# Patient Record
Sex: Male | Born: 1955 | Race: Black or African American | Hispanic: No | Marital: Married | Smoking: Current every day smoker
Health system: Southern US, Community
[De-identification: ages and names within clinical notes are randomized; demographics above are authoritative.]

## PROBLEM LIST (undated history)

## (undated) DIAGNOSIS — C9000 Multiple myeloma not having achieved remission: Secondary | ICD-10-CM

## (undated) DIAGNOSIS — N400 Enlarged prostate without lower urinary tract symptoms: Secondary | ICD-10-CM

## (undated) DIAGNOSIS — J45909 Unspecified asthma, uncomplicated: Secondary | ICD-10-CM

## (undated) DIAGNOSIS — R109 Unspecified abdominal pain: Secondary | ICD-10-CM

## (undated) DIAGNOSIS — K5090 Crohn's disease, unspecified, without complications: Secondary | ICD-10-CM

## (undated) HISTORY — PX: SMALL INTESTINE SURGERY: SHX150

---

## 2011-04-29 ENCOUNTER — Emergency Department (HOSPITAL_BASED_OUTPATIENT_CLINIC_OR_DEPARTMENT_OTHER)
Admission: EM | Admit: 2011-04-29 | Discharge: 2011-04-29 | Disposition: A | Discharge status: Home / Self Care | Payer: Medicare Other | Attending: Emergency Medicine | Admitting: Emergency Medicine

## 2011-04-29 DIAGNOSIS — M25539 Pain in unspecified wrist: Secondary | ICD-10-CM | POA: Insufficient documentation

## 2011-04-29 DIAGNOSIS — F17200 Nicotine dependence, unspecified, uncomplicated: Secondary | ICD-10-CM | POA: Insufficient documentation

## 2011-04-29 DIAGNOSIS — K5090 Crohn's disease, unspecified, without complications: Secondary | ICD-10-CM | POA: Insufficient documentation

## 2012-03-28 DIAGNOSIS — N529 Male erectile dysfunction, unspecified: Secondary | ICD-10-CM | POA: Insufficient documentation

## 2013-01-16 ENCOUNTER — Emergency Department (HOSPITAL_BASED_OUTPATIENT_CLINIC_OR_DEPARTMENT_OTHER): Payer: Medicare Other

## 2013-01-16 ENCOUNTER — Emergency Department (HOSPITAL_BASED_OUTPATIENT_CLINIC_OR_DEPARTMENT_OTHER)
Admission: EM | Admit: 2013-01-16 | Discharge: 2013-01-17 | Disposition: A | Discharge status: Home / Self Care | Payer: Medicare Other | Attending: Emergency Medicine | Admitting: Emergency Medicine

## 2013-01-16 ENCOUNTER — Encounter (HOSPITAL_BASED_OUTPATIENT_CLINIC_OR_DEPARTMENT_OTHER): Payer: Self-pay | Admitting: Emergency Medicine

## 2013-01-16 DIAGNOSIS — F17200 Nicotine dependence, unspecified, uncomplicated: Secondary | ICD-10-CM | POA: Insufficient documentation

## 2013-01-16 DIAGNOSIS — N400 Enlarged prostate without lower urinary tract symptoms: Secondary | ICD-10-CM | POA: Insufficient documentation

## 2013-01-16 DIAGNOSIS — K5090 Crohn's disease, unspecified, without complications: Secondary | ICD-10-CM

## 2013-01-16 DIAGNOSIS — Z79899 Other long term (current) drug therapy: Secondary | ICD-10-CM | POA: Insufficient documentation

## 2013-01-16 DIAGNOSIS — R112 Nausea with vomiting, unspecified: Secondary | ICD-10-CM | POA: Insufficient documentation

## 2013-01-16 DIAGNOSIS — K56609 Unspecified intestinal obstruction, unspecified as to partial versus complete obstruction: Secondary | ICD-10-CM

## 2013-01-16 DIAGNOSIS — R630 Anorexia: Secondary | ICD-10-CM | POA: Insufficient documentation

## 2013-01-16 HISTORY — DX: Unspecified abdominal pain: R10.9

## 2013-01-16 HISTORY — DX: Benign prostatic hyperplasia without lower urinary tract symptoms: N40.0

## 2013-01-16 LAB — URINALYSIS, ROUTINE W REFLEX MICROSCOPIC
Glucose, UA: NEGATIVE mg/dL
Hgb urine dipstick: NEGATIVE
Specific Gravity, Urine: 1.035 — ABNORMAL HIGH (ref 1.005–1.030)
Urobilinogen, UA: 1 mg/dL (ref 0.0–1.0)
pH: 6 (ref 5.0–8.0)

## 2013-01-16 LAB — COMPREHENSIVE METABOLIC PANEL
ALT: 7 U/L (ref 0–53)
AST: 18 U/L (ref 0–37)
Albumin: 3.5 g/dL (ref 3.5–5.2)
Alkaline Phosphatase: 98 U/L (ref 39–117)
Calcium: 9.8 mg/dL (ref 8.4–10.5)
Potassium: 3.2 mEq/L — ABNORMAL LOW (ref 3.5–5.1)
Sodium: 146 mEq/L — ABNORMAL HIGH (ref 135–145)
Total Protein: 7.7 g/dL (ref 6.0–8.3)

## 2013-01-16 LAB — CBC WITH DIFFERENTIAL/PLATELET
Basophils Absolute: 0 10*3/uL (ref 0.0–0.1)
Eosinophils Absolute: 0 10*3/uL (ref 0.0–0.7)
Eosinophils Relative: 0 % (ref 0–5)
Lymphs Abs: 1.5 10*3/uL (ref 0.7–4.0)
MCH: 28.4 pg (ref 26.0–34.0)
MCV: 83.6 fL (ref 78.0–100.0)
Neutrophils Relative %: 67 % (ref 43–77)
Platelets: 342 10*3/uL (ref 150–400)
RBC: 4.83 MIL/uL (ref 4.22–5.81)
RDW: 16.1 % — ABNORMAL HIGH (ref 11.5–15.5)
WBC: 7.7 10*3/uL (ref 4.0–10.5)

## 2013-01-16 MED ORDER — ONDANSETRON HCL 4 MG/2ML IJ SOLN
4.0000 mg | Freq: Once | INTRAMUSCULAR | Status: AC
  Administered 2013-01-16: 4 mg via INTRAVENOUS
  Filled 2013-01-16: qty 2

## 2013-01-16 MED ORDER — SODIUM CHLORIDE 0.9 % IV SOLN
Freq: Once | INTRAVENOUS | Status: AC
  Administered 2013-01-16: 22:00:00 via INTRAVENOUS

## 2013-01-16 MED ORDER — HYDROMORPHONE HCL PF 1 MG/ML IJ SOLN
1.0000 mg | Freq: Once | INTRAMUSCULAR | Status: AC
  Administered 2013-01-16: 1 mg via INTRAVENOUS
  Filled 2013-01-16: qty 1

## 2013-01-16 NOTE — ED Notes (Signed)
Patient informed of need for transfer to [facility] Emergency Department, pt states he does not want to go by EMS, but by private vehicle. Fannie Knee RN, spoke with patient about advantages of transferring via EMS and possible process he will experience if choosing to leave AMA and go by private vehicle. Pt given time to discuss wishes w/ family. Patient requesting to sign out AMA and go by private vehicle to [facility] ED. MD notified. This RN will notify [facility] ED Charge nurse of expectant arrival of patient.

## 2013-01-16 NOTE — ED Notes (Signed)
Patient refusing placement of NG tube at this time, states he has had previous procedure in the past and it was painful, does not want procedure at this time. Pt educated by Fannie Knee, RN on NG tube placement and outcomes r/t to small bowel obstruction. Pt still refused NG at this time. MD will be notified.

## 2013-01-16 NOTE — ED Notes (Signed)
Patient transported to X-ray via stretcher 

## 2013-01-16 NOTE — ED Notes (Signed)
Evan Gallegos, Consulting civil engineer at [facility] Emergency Department & gave report on patient & informed her of patient's expectant arrival by private vehicle.

## 2013-01-16 NOTE — ED Notes (Signed)
Pt with history of chron's developed abdominal pain this am that has progressed throughtout the day, pt reports last flair was several years ago

## 2013-01-16 NOTE — ED Notes (Signed)
Patient back from  X-ray 

## 2013-01-16 NOTE — ED Provider Notes (Signed)
History     CSN: [id number]  Arrival date & time 01/16/13  2111   First MD Initiated Contact with Patient 01/16/13 2145      Chief Complaint  Patient presents with  . Abdominal Pain    (Consider location/radiation/quality/duration/timing/severity/associated sxs/prior treatment) Patient is a 57 y.o. male presenting with abdominal pain. The history is provided by the patient. No language interpreter was used.  Abdominal Pain The primary symptoms of the illness include abdominal pain, nausea and vomiting. The onset of the illness was gradual. The problem has not changed since onset. The patient states that she believes she is currently not pregnant. Additional symptoms associated with the illness include chills. Significant associated medical issues include inflammatory bowel disease.  Pt has a history of Chron's.   Pt feels like he is having a flare up.   Pt complains of nausea and pain.   Pt has had bowel resections x 2  Past Medical History  Diagnosis Date  . Prostate enlargement   . Abdominal  pain, other specified site     crhon's disease    History reviewed. No pertinent past surgical history.  History reviewed. No pertinent family history.  History  Substance Use Topics  . Smoking status: Current Every Day Smoker -- 0.5 packs/day    Types: Cigarettes  . Smokeless tobacco: Not on file  . Alcohol Use: No      Review of Systems  Constitutional: Positive for chills.  Gastrointestinal: Positive for nausea, vomiting and abdominal pain.  All other systems reviewed and are negative.    Allergies  Penicillins  Home Medications   Current Outpatient Rx  Name  Route  Sig  Dispense  Refill  . PREDNISONE 5 MG PO TABS   Oral   Take 5 mg by mouth daily.         Marland Kitchen TAMSULOSIN HCL 0.4 MG PO CAPS   Oral   Take 0.4 mg by mouth.           BP 143/103  Pulse 73  Temp 98.4 F (36.9 C) (Oral)  Resp 18  Ht 5\' 11"  (1.803 m)  Wt 152 lb (68.947 kg)  BMI 21.20 kg/m2   SpO2 100%  Physical Exam  Nursing note and vitals reviewed. Constitutional: He appears well-developed and well-nourished.  HENT:  Head: Normocephalic and atraumatic.  Eyes: Conjunctivae normal are normal. Pupils are equal, round, and reactive to light.  Neck: Normal range of motion. Neck supple.  Cardiovascular: Normal rate and normal heart sounds.   Pulmonary/Chest: Effort normal and breath sounds normal.  Abdominal: Soft. There is tenderness. There is guarding.  Musculoskeletal: Normal range of motion.  Neurological: He is alert.  Skin: Skin is warm.  Psychiatric: He has a normal mood and affect.    ED Course  Procedures (including critical care time)  Labs Reviewed - No data to display No results found.   No diagnosis found.    MDM  Pt given Iv fluids,  Dilaudid, zofran and labs ordered        Lonia Skinner [place], [place] 01/16/13 2149

## 2013-01-16 NOTE — ED Notes (Signed)
MD at bedside. 

## 2013-01-16 NOTE — ED Provider Notes (Addendum)
History     CSN: [id number]  Arrival date & time 01/16/13  2111   First MD Initiated Contact with Patient 01/16/13 2145      Chief Complaint  Patient presents with  . Abdominal Pain    (Consider location/radiation/quality/duration/timing/severity/associated sxs/prior treatment) Patient is a 57 y.o. male presenting with abdominal pain. The history is provided by the patient.  Abdominal Pain The primary symptoms of the illness include abdominal pain, nausea and vomiting. The primary symptoms of the illness do not include fever or diarrhea. The current episode started 6 to 12 hours ago. The onset of the illness was gradual. The problem has been gradually worsening.  The abdominal pain is generalized. The abdominal pain does not radiate. The severity of the abdominal pain is 10/10. The abdominal pain is relieved by nothing.  Additional symptoms associated with the illness include anorexia. Symptoms associated with the illness do not include frequency or back pain. Significant associated medical issues include inflammatory bowel disease.  Pt has felt bloated.  No BM today.  He has history of Crohn's with prior partial bowel resection.  Past Medical History  Diagnosis Date  . Prostate enlargement   . Abdominal  pain, other specified site     crhon's disease    History reviewed. No pertinent past surgical history.  History reviewed. No pertinent family history.  History  Substance Use Topics  . Smoking status: Current Every Day Smoker -- 0.5 packs/day    Types: Cigarettes  . Smokeless tobacco: Not on file  . Alcohol Use: No      Review of Systems  Constitutional: Negative for fever.  Gastrointestinal: Positive for nausea, vomiting, abdominal pain and anorexia. Negative for diarrhea.  Genitourinary: Negative for frequency.  Musculoskeletal: Negative for back pain.  All other systems reviewed and are negative.    Allergies  Penicillins  Home Medications   Current  Outpatient Rx  Name  Route  Sig  Dispense  Refill  . PREDNISONE 5 MG PO TABS   Oral   Take 5 mg by mouth daily.         Marland Kitchen TAMSULOSIN HCL 0.4 MG PO CAPS   Oral   Take 0.4 mg by mouth.           BP 143/103  Pulse 73  Temp 98.4 F (36.9 C) (Oral)  Resp 18  Ht 5\' 11"  (1.803 m)  Wt 152 lb (68.947 kg)  BMI 21.20 kg/m2  SpO2 100%  Physical Exam  Nursing note and vitals reviewed. Constitutional: He appears well-developed and well-nourished. No distress.  HENT:  Head: Normocephalic and atraumatic.  Right Ear: External ear normal.  Left Ear: External ear normal.  Eyes: Conjunctivae normal are normal. Right eye exhibits no discharge. Left eye exhibits no discharge. No scleral icterus.  Neck: Neck supple. No tracheal deviation present.  Cardiovascular: Normal rate, regular rhythm and intact distal pulses.   Pulmonary/Chest: Effort normal and breath sounds normal. No stridor. No respiratory distress. He has no wheezes. He has no rales.  Abdominal: Soft. He exhibits no distension, no ascites and no mass. Bowel sounds are decreased. There is generalized tenderness. There is guarding. There is no rigidity, no rebound and no CVA tenderness. No hernia.  Musculoskeletal: He exhibits no edema and no tenderness.  Neurological: He is alert. He has normal strength. No sensory deficit. Cranial nerve deficit:  no gross defecits noted. He exhibits normal muscle tone. He displays no seizure activity. Coordination normal.  Skin: Skin is warm and  dry. No rash noted.  Psychiatric: He has a normal mood and affect.    ED Course  Procedures (including critical care time) IV pain medications and antiemetics.  Symptoms improving.   Labs Reviewed  CBC WITH DIFFERENTIAL - Abnormal; Notable for the following:    RDW 16.1 (*)     Monocytes Relative 13 (*)     All other components within normal limits  COMPREHENSIVE METABOLIC PANEL - Abnormal; Notable for the following:    Sodium 146 (*)     Potassium  3.2 (*)     Glucose, Bld 117 (*)     Total Bilirubin 0.2 (*)     GFR calc non Af Amer 60 (*)     GFR calc Af Amer 70 (*)     All other components within normal limits  URINALYSIS, ROUTINE W REFLEX MICROSCOPIC - Abnormal; Notable for the following:    Color, Urine AMBER (*)  BIOCHEMICALS MAY BE AFFECTED BY COLOR   Specific Gravity, Urine 1.035 (*)     Bilirubin Urine SMALL (*)     Ketones, ur 15 (*)     All other components within normal limits  LIPASE, BLOOD   Dg Abd Acute W/chest  01/16/2013  *RADIOLOGY REPORT*  Clinical Data: Sudden onset lower abdominal pain.  History of partial small bowel removal.  ACUTE ABDOMEN SERIES (ABDOMEN 2 VIEW & CHEST 1 VIEW)  Comparison: None.  Findings: The heart size and pulmonary vascularity are normal. The lungs appear clear and expanded without focal air space disease or consolidation. No blunting of the costophrenic angles.  No pneumothorax.  Mediastinal contours appear intact.  There is marked gaseous distension of mid abdominal small bowel loops with multiple air-fluid levels consistent with high-grade small bowel obstruction.  Paucity of gas and stool in the colon. No free intra-abdominal air.  Postsurgical changes in the mid abdomen.  Degenerative changes in the spine and hips.  IMPRESSION: No evidence of active pulmonary disease.  Gas distended small bowel with multiple air-fluid levels consistent with small bowel obstruction.   Original Report Authenticated By: Burman Nieves, M.D.      1. Small bowel obstruction   2. Crohn's disease       MDM  Plain films demonstrate a small bowel obstruction.  Pt would like to be transferred to [facility].  Will order an NG tube and arrange transfer.    Celene Kras, MD 01/16/13 2315  Pt has now informed us that he is going to have his wife drive him to [facility].  We cautioned him against this plan. He refuses EMS transfer.  Also refuses NG tube.  Will notify [facility].  Pt is leaving AMA to transport  himself to [facility].  Celene Kras, MD 01/16/13 [phone]

## 2013-01-17 DIAGNOSIS — K56609 Unspecified intestinal obstruction, unspecified as to partial versus complete obstruction: Secondary | ICD-10-CM | POA: Insufficient documentation

## 2013-09-25 ENCOUNTER — Encounter (HOSPITAL_BASED_OUTPATIENT_CLINIC_OR_DEPARTMENT_OTHER): Payer: Self-pay | Admitting: Emergency Medicine

## 2013-09-25 ENCOUNTER — Emergency Department (HOSPITAL_BASED_OUTPATIENT_CLINIC_OR_DEPARTMENT_OTHER)
Admission: EM | Admit: 2013-09-25 | Discharge: 2013-09-25 | Disposition: A | Discharge status: Home / Self Care | Payer: Medicare Other | Attending: Emergency Medicine | Admitting: Emergency Medicine

## 2013-09-25 DIAGNOSIS — N400 Enlarged prostate without lower urinary tract symptoms: Secondary | ICD-10-CM | POA: Insufficient documentation

## 2013-09-25 DIAGNOSIS — Z880 Allergy status to penicillin: Secondary | ICD-10-CM | POA: Insufficient documentation

## 2013-09-25 DIAGNOSIS — F17200 Nicotine dependence, unspecified, uncomplicated: Secondary | ICD-10-CM | POA: Insufficient documentation

## 2013-09-25 DIAGNOSIS — IMO0002 Reserved for concepts with insufficient information to code with codable children: Secondary | ICD-10-CM | POA: Insufficient documentation

## 2013-09-25 DIAGNOSIS — K5090 Crohn's disease, unspecified, without complications: Secondary | ICD-10-CM

## 2013-09-25 DIAGNOSIS — E876 Hypokalemia: Secondary | ICD-10-CM | POA: Insufficient documentation

## 2013-09-25 HISTORY — DX: Crohn's disease, unspecified, without complications: K50.90

## 2013-09-25 LAB — CBC WITH DIFFERENTIAL/PLATELET
Eosinophils Relative: 0 % (ref 0–5)
HCT: 39 % (ref 39.0–52.0)
Hemoglobin: 13 g/dL (ref 13.0–17.0)
Lymphocytes Relative: 10 % — ABNORMAL LOW (ref 12–46)
Lymphs Abs: 0.8 10*3/uL (ref 0.7–4.0)
MCV: 86.3 fL (ref 78.0–100.0)
Monocytes Relative: 10 % (ref 3–12)
Platelets: 315 10*3/uL (ref 150–400)
RBC: 4.52 MIL/uL (ref 4.22–5.81)
WBC: 7.8 10*3/uL (ref 4.0–10.5)

## 2013-09-25 LAB — URINE MICROSCOPIC-ADD ON

## 2013-09-25 LAB — COMPREHENSIVE METABOLIC PANEL
ALT: 8 U/L (ref 0–53)
AST: 20 U/L (ref 0–37)
CO2: 30 mEq/L (ref 19–32)
Chloride: 101 mEq/L (ref 96–112)
GFR calc non Af Amer: 66 mL/min — ABNORMAL LOW (ref >90)
Sodium: 143 mEq/L (ref 135–145)
Total Bilirubin: 0.2 mg/dL — ABNORMAL LOW (ref 0.3–1.2)

## 2013-09-25 LAB — URINALYSIS, ROUTINE W REFLEX MICROSCOPIC
Glucose, UA: NEGATIVE mg/dL
Hgb urine dipstick: NEGATIVE
Ketones, ur: 15 mg/dL — AB
Leukocytes, UA: NEGATIVE
pH: 6 (ref 5.0–8.0)

## 2013-09-25 MED ORDER — POTASSIUM CHLORIDE ER 10 MEQ PO TBCR: 20.0000 meq | EXTENDED_RELEASE_TABLET | Freq: Every day | ORAL | Status: AC

## 2013-09-25 MED ORDER — MORPHINE SULFATE 4 MG/ML IJ SOLN
2.0000 mg | Freq: Once | INTRAMUSCULAR | Status: AC
  Administered 2013-09-25: 2 mg via INTRAVENOUS
  Filled 2013-09-25: qty 1

## 2013-09-25 MED ORDER — PREDNISONE 10 MG PO TABS: ORAL_TABLET | ORAL | Status: DC

## 2013-09-25 MED ORDER — POTASSIUM CHLORIDE CRYS ER 20 MEQ PO TBCR
40.0000 meq | EXTENDED_RELEASE_TABLET | Freq: Once | ORAL | Status: AC
  Administered 2013-09-25: 40 meq via ORAL
  Filled 2013-09-25: qty 2

## 2013-09-25 MED ORDER — SODIUM CHLORIDE 0.9 % IV BOLUS (SEPSIS)
1000.0000 mL | Freq: Once | INTRAVENOUS | Status: AC
  Administered 2013-09-25: 1000 mL via INTRAVENOUS

## 2013-09-25 NOTE — ED Notes (Signed)
Pt sts that he has Crohn's Disease and has had diarrhea, nausea vomiting since yesterday afternoon; only vomiting twice last night; +chills, which pt reports normal with Crohn's exacerbation; decrease in PO intake. Pt sts he thinks he is dehydrated.

## 2013-09-25 NOTE — ED Notes (Signed)
Pt told this RN and MD that pt's pharmacy was previously [facility] on [address] in [place], but then had all of his meds transferred to [place] on [address] 2 months ago. This RN called [facility] and pharmacy sts that the only meds they have are Vit D2 and Vit D3, which have been filled but never picked up. Unable to contact any staff at [facility] on [address], MD made aware of this info.

## 2013-09-25 NOTE — ED Provider Notes (Signed)
CSN: [id number]     Arrival date & time 09/25/13  1155 History   None    Chief Complaint  Patient presents with  . Crohn's Disease    HPI  Evan Gallegos is a 57 y.o. male with a PMH of Crohn's disease who presents to the ED for evaluation of Chron's disease.  History was provided by the patient.  Patient is a poor historian. Patient states that he developed abdominal pain, nausea, vomiting, and diarrhea last night. He states his stool is watery and brown. No hematochezia rectal bleeding or rectal pain. He also had 2-3 episodes of emesis last night however no emesis today. He is currently nauseated. He also has had chills with no fever. He states that this is his normal symptoms when he gets a Crohn's exacerbation. He states he feels dehydrated and has not been able to your drink anything today. He complains of periumbilical aching abdominal pain. He states that this pain comes and goes. He is not taking anything for pain prior to arrival. He states his medications for his Crohn's however cannot elaborate on this. He states was previously on prednisone however stopped taking it approximately one week ago. Patient states that [facility] (name unknown) has been prescribing and prednisone to take as needed when he gets a flareup. He states that he is out of his prednisone. He has a previous surgery for his Crohn's with bowel removal.  He otherwise has been well with no cough, rhinorrhea, sore throat, headache, dizziness, lightheadedness.    Past Medical History  Diagnosis Date  . Prostate enlargement   . Abdominal  pain, other specified site     crhon's disease   No past surgical history on file. No family history on file. History  Substance Use Topics  . Smoking status: Current Every Day Smoker -- 0.50 packs/day    Types: Cigarettes  . Smokeless tobacco: Not on file  . Alcohol Use: No    Review of Systems  Constitutional: Positive for chills. Negative  for fever, activity change, appetite change and fatigue.  HENT: Negative for congestion, ear pain, rhinorrhea and sore throat.   Eyes: Negative for visual disturbance.  Respiratory: Negative for cough, shortness of breath and wheezing.   Cardiovascular: Negative for chest pain and leg swelling.  Gastrointestinal: Positive for nausea, vomiting, abdominal pain and diarrhea. Negative for constipation, blood in stool and rectal pain.  Genitourinary: Negative for dysuria, hematuria, flank pain and difficulty urinating.  Musculoskeletal: Negative for back pain, gait problem and myalgias.  Skin: Negative for color change and wound.  Neurological: Negative for dizziness, syncope, weakness, light-headedness, numbness and headaches.  Psychiatric/Behavioral: Negative for confusion.    Allergies  Penicillins  Home Medications   Current Outpatient Rx  Name  Route  Sig  Dispense  Refill  . predniSONE (DELTASONE) 5 MG tablet   Oral   Take 5 mg by mouth daily.         . Tamsulosin HCl (FLOMAX) 0.4 MG CAPS   Oral   Take 0.4 mg by mouth.          BP 153/91  Pulse 64  Temp(Src) 97.4 F (36.3 C) (Oral)  Resp 18  SpO2 100%  Filed Vitals:   09/25/13 1210 09/25/13 1228  BP: 153/91   Pulse: 64   Temp: 97.4 F (36.3 C)   TempSrc: Oral   Resp: 18   Height:  5\' 10"  (1.778 m)  Weight:  152  lb (68.947 kg)  SpO2: 100%     Physical Exam  Nursing note and vitals reviewed. Constitutional: He is oriented to person, place, and time. He appears well-developed and well-nourished. No distress.  HENT:  Head: Normocephalic and atraumatic.  Right Ear: External ear normal.  Left Ear: External ear normal.  Nose: Nose normal.  Mouth/Throat: Oropharynx is clear and moist. No oropharyngeal exudate.  Eyes: Conjunctivae are normal. Pupils are equal, round, and reactive to light. Right eye exhibits no discharge. Left eye exhibits no discharge.  Neck: Normal range of motion. Neck supple.   Cardiovascular: Normal rate, regular rhythm, normal heart sounds and intact distal pulses.  Exam reveals no gallop and no friction rub.   No murmur heard. Pulmonary/Chest: Effort normal and breath sounds normal. No respiratory distress. He has no wheezes. He has no rales. He exhibits no tenderness.  Abdominal: Soft. Bowel sounds are normal. He exhibits no distension and no mass. There is no tenderness. There is no rebound and no guarding.  Surgical scar present on the abdomen   Genitourinary: Guaiac negative stool.  No anal fissures or hemorrhoids. No palpable stool in the rectal vault. No gross blood.   Musculoskeletal: Normal range of motion. He exhibits no edema and no tenderness.  Neurological: He is alert and oriented to person, place, and time.  Skin: Skin is warm and dry. He is not diaphoretic.    ED Course  Procedures (including critical care time) Labs Review Labs Reviewed - No data to display Imaging Review No results found.  EKG Interpretation   None      Results for orders placed during the hospital encounter of 09/25/13  URINE CULTURE      Result Value Range   Specimen Description URINE, RANDOM     Special Requests NONE     Culture  Setup Time       Value: 09/26/2013 09:26     Performed at [facility] Count       Value: 9,000 COLONIES/ML     Performed at [facility]   Culture       Value: INSIGNIFICANT GROWTH     Performed at [facility]   Report Status 09/27/2013 FINAL    CBC WITH DIFFERENTIAL      Result Value Range   WBC 7.8  4.0 - 10.5 K/uL   RBC 4.52  4.22 - 5.81 MIL/uL   Hemoglobin 13.0  13.0 - 17.0 g/dL   HCT 45.4  09.8 - 11.9 %   MCV 86.3  78.0 - 100.0 fL   MCH 28.8  26.0 - 34.0 pg   MCHC 33.3  30.0 - 36.0 g/dL   RDW 14.7 (*) 82.9 - 56.2 %   Platelets 315  150 - 400 K/uL   Neutrophils Relative % 81 (*) 43 - 77 %   Neutro Abs 6.3  1.7 - 7.7 K/uL   Lymphocytes Relative 10 (*) 12 - 46 %   Lymphs Abs 0.8  0.7  - 4.0 K/uL   Monocytes Relative 10  3 - 12 %   Monocytes Absolute 0.8  0.1 - 1.0 K/uL   Eosinophils Relative 0  0 - 5 %   Eosinophils Absolute 0.0  0.0 - 0.7 K/uL   Basophils Relative 0  0 - 1 %   Basophils Absolute 0.0  0.0 - 0.1 K/uL  URINALYSIS, ROUTINE W REFLEX MICROSCOPIC      Result Value Range   Color, Urine YELLOW  YELLOW   APPearance CLEAR  CLEAR   Specific Gravity, Urine 1.034 (*) 1.005 - 1.030   pH 6.0  5.0 - 8.0   Glucose, UA NEGATIVE  NEGATIVE mg/dL   Hgb urine dipstick NEGATIVE  NEGATIVE   Bilirubin Urine SMALL (*) NEGATIVE   Ketones, ur 15 (*) NEGATIVE mg/dL   Protein, ur 30 (*) NEGATIVE mg/dL   Urobilinogen, UA 0.2  0.0 - 1.0 mg/dL   Nitrite NEGATIVE  NEGATIVE   Leukocytes, UA NEGATIVE  NEGATIVE  COMPREHENSIVE METABOLIC PANEL      Result Value Range   Sodium 143  135 - 145 mEq/L   Potassium 3.0 (*) 3.5 - 5.1 mEq/L   Chloride 101  96 - 112 mEq/L   CO2 30  19 - 32 mEq/L   Glucose, Bld 100 (*) 70 - 99 mg/dL   BUN 18  6 - 23 mg/dL   Creatinine, Ser 1.61  0.50 - 1.35 mg/dL   Calcium 9.4  8.4 - 09.6 mg/dL   Total Protein 7.2  6.0 - 8.3 g/dL   Albumin 3.1 (*) 3.5 - 5.2 g/dL   AST 20  0 - 37 U/L   ALT 8  0 - 53 U/L   Alkaline Phosphatase 94  39 - 117 U/L   Total Bilirubin 0.2 (*) 0.3 - 1.2 mg/dL   GFR calc non Af Amer 66 (*) >90 mL/min   GFR calc Af Amer 76 (*) >90 mL/min  LIPASE, BLOOD      Result Value Range   Lipase 9 (*) 11 - 59 U/L  OCCULT BLOOD X 1 CARD TO LAB, STOOL      Result Value Range   Fecal Occult Bld NEGATIVE  NEGATIVE  URINE MICROSCOPIC-ADD ON      Result Value Range   Squamous Epithelial / LPF RARE  RARE   Bacteria, UA FEW (*) RARE   Casts HYALINE CASTS (*) NEGATIVE   Urine-Other MUCOUS PRESENT      MDM   1. Exacerbation of Crohn's disease     Hakeen Shipes is a 57 y.o. male with a PMH of Crohn's disease who presents to the ED for evaluation of Chron's disease.  Morphine ordered for pain.  1L normal saline.  UA, occult stool, CMP,  lipase, UA and CBC ordered.  Charts were reviewed from [facility] where he receives most of his care for his Chron's.  Patient is on Imuran and GI specialist is Dr. Lorenda Peck.  He was last seen on 08/29/13 at [facility] by his GI specialist for a follow-up appointment.    Rechecks  6:35 PM = Patient states he is feeling much better.  No nausea. Feels ready enough to be discharged  6:40 PM = Patient sleeping when I entered the room.  Repeat abdominal pain benign.    Consults  6:30 PM = Spoke with Dr. Angelyn Punt with [facility] who states the patient can be started on a prednisone taper (dose given) and follow-up in clinic      Etiology of abdominal pain, nausea, diarrhea, and emesis is likely due to an exacerbation of his Chron's Diesease. His abdominal exam was benign, patient afebrile, and and he remained in no acute distress throughout his ED visit. Patient states his symptoms are similar to his Chron's exacerbations in the past.  Patient was found to have hypokalemia and was supplement with K-dur in the ED and given an OP prescription for supplementation.  H&H stable. His GI specialist was contacted.  Patient was prescribed prednisone for outpatient management.  Patient was instructed to return to the ED if they experience any repeated vomiting, blood in stool, severe/worsening abdominal pain, fever, or other concerns.  Patient encouraged to make an appointment with his GI specialist as soon as possible.  Patient was in agreement with discharge and plan.     Final impressions: 1. Chron's exacerbation  2. Hypokalemia     Luiz Iron PA-C   This patient was discussed with Dr. Criss Alvine who evaluated the patient       Jillyn Ledger, PA-C 09/27/13 1229

## 2013-09-27 LAB — URINE CULTURE: Colony Count: 9000

## 2013-09-29 NOTE — ED Provider Notes (Signed)
Medical screening examination/treatment/procedure(s) were performed by non-physician practitioner and as supervising physician I was immediately available for consultation/collaboration.   Naser Schuld T Jermaine Neuharth, MD 09/29/13 1413 

## 2013-12-17 DIAGNOSIS — K5090 Crohn's disease, unspecified, without complications: Secondary | ICD-10-CM | POA: Insufficient documentation

## 2014-10-16 DIAGNOSIS — N433 Hydrocele, unspecified: Secondary | ICD-10-CM | POA: Insufficient documentation

## 2014-10-16 DIAGNOSIS — N400 Enlarged prostate without lower urinary tract symptoms: Secondary | ICD-10-CM | POA: Insufficient documentation

## 2015-06-02 ENCOUNTER — Emergency Department (HOSPITAL_BASED_OUTPATIENT_CLINIC_OR_DEPARTMENT_OTHER)
Admission: EM | Admit: 2015-06-02 | Discharge: 2015-06-02 | Disposition: A | Discharge status: Home / Self Care | Payer: Medicare Other | Attending: Emergency Medicine | Admitting: Emergency Medicine

## 2015-06-02 ENCOUNTER — Encounter (HOSPITAL_BASED_OUTPATIENT_CLINIC_OR_DEPARTMENT_OTHER): Payer: Self-pay | Admitting: *Deleted

## 2015-06-02 DIAGNOSIS — M25561 Pain in right knee: Secondary | ICD-10-CM

## 2015-06-02 DIAGNOSIS — Z720 Tobacco use: Secondary | ICD-10-CM | POA: Insufficient documentation

## 2015-06-02 DIAGNOSIS — Z79899 Other long term (current) drug therapy: Secondary | ICD-10-CM | POA: Insufficient documentation

## 2015-06-02 DIAGNOSIS — N400 Enlarged prostate without lower urinary tract symptoms: Secondary | ICD-10-CM | POA: Diagnosis not present

## 2015-06-02 DIAGNOSIS — Z880 Allergy status to penicillin: Secondary | ICD-10-CM | POA: Diagnosis not present

## 2015-06-02 DIAGNOSIS — Z8719 Personal history of other diseases of the digestive system: Secondary | ICD-10-CM | POA: Insufficient documentation

## 2015-06-02 DIAGNOSIS — M25531 Pain in right wrist: Secondary | ICD-10-CM

## 2015-06-02 DIAGNOSIS — Z7952 Long term (current) use of systemic steroids: Secondary | ICD-10-CM | POA: Insufficient documentation

## 2015-06-02 MED ORDER — HYDROCODONE-ACETAMINOPHEN 5-325 MG PO TABS: ORAL_TABLET | ORAL | Status: DC

## 2015-06-02 NOTE — Discharge Instructions (Signed)
For pain control please take Ibuprofen (also known as Motrin or Advil) 400mg  (this is normally 2 over the counter pills) every 6 hours. Take with food to minimize stomach irritation.  Take vicodin for breakthrough pain, do not drink alcohol, drive, care for children or do other critical tasks while taking vicodin.  Please be very careful not to fall! The pain medication and cane puts you at risk for falls. Please rest as much as possible and try to not stay alone.   Do not hesitate to return to the emergency room for any new, worsening or concerning symptoms.  Please obtain primary care using resource guide below. Let them know that you were seen in the emergency room and that they will need to obtain records for further outpatient management.    Emergency Department Resource Guide 1) Find a Doctor and Pay Out of Pocket Although you won't have to find out who is covered by your insurance plan, it is a good idea to ask around and get recommendations. You will then need to call the office and see if the doctor you have chosen will accept you as a new patient and what types of options they offer for patients who are self-pay. Some doctors offer discounts or will set up payment plans for their patients who do not have insurance, but you will need to ask so you aren't surprised when you get to your appointment.  2) Contact Your Local Health Department Not all health departments have doctors that can see patients for sick visits, but many do, so it is worth a call to see if yours does. If you don't know where your local health department is, you can check in your phone book. The CDC also has a tool to help you locate your state's health department, and many state websites also have listings of all of their local health departments.  3) Find a [facility] If your illness is not likely to be very severe or complicated, you may want to try a walk in clinic. These are popping up all over the country in  pharmacies, drugstores, and shopping centers. They're usually staffed by nurse practitioners or physician assistants that have been trained to treat common illnesses and complaints. They're usually fairly quick and inexpensive. However, if you have serious medical issues or chronic medical problems, these are probably not your best option.  No Primary Care Doctor: - Call Health Connect at  [phone] - they can help you locate a primary care doctor that  accepts your insurance, provides certain services, etc. - Physician Referral Service- [phone]  Chronic Pain Problems: Organization         Address  Phone   Notes  [facility]  [phone] Patients need to be referred by their primary care doctor.   Medication Assistance: Organization         Address  Phone   Notes  [facility] Medication [facility] [address] [place], [place] [address] [phone] --Must be a resident of [facility] -- Must have NO insurance coverage whatsoever (no Medicaid/ Medicare, etc.) -- The pt. MUST have a primary care doctor that directs their care regularly and follows them in the community   MedAssist  [phone]   [employer]  [phone]    Agencies that provide inexpensive medical care: Organization         Address  Phone   Notes  [place]  [phone])  [facility]    [phone]   [facility] [place], [place] [address] [phone]   [place] [address], [place] [phone]   Planned Parenthood    [phone]   [facility]    [phone]   [place] and [place] [address], [place] Phone:  [phone], Fax:  [phone] Hours of Operation:  9 am - 6 pm, M-F.  Also accepts Medicaid/Medicare and self-pay.  [facility] for [place], [address],  [place] Phone: [phone], Fax: [phone]. Hours of Operation:  8:30 am - 5:30 pm, M-F.  Also accepts Medicaid and self-pay.  [facility] [address], [place] Phone: [phone]   [place], [place], [place] [phone], Ext. [phone] Mondays & Thursdays: 7-9 AM.  First 15 patients are seen on a first come, first serve basis.    [place] Providers:  Organization         Address  Phone   Notes  [facility] [address], [place] [phone] Also accepts self-pay patients.  [facility] [address], [place]  [phone]   [place], [address], [place] [phone]   [facility] [address], [place] [phone]   Lucianne Lei [address], [place]   [phone] Only accepts [place] Access [place] patients after they have their name applied to their card.   Self-Pay (no insurance) in [facility]:  Organization         Address  Phone   Notes  Sickle Cell Patients, [facility] [place] [phone]   [facility] [place] [phone]   [facility]  [place], [place], [place] [phone]   [facility]/Dr. Osei-Bonsu  [address], [place] or [address], [place] [phone] Phone number for both [place] and [place] locations is the same.  [facility] [address], [place] [phone]   [facility] [address] [place] or [address] [phone]   [facility] [address] [place] [phone], phone; [phone], fax Sees patients 1st and 3rd Saturday of every month.  Must not  qualify for public or private insurance (i.e. Medicaid, Medicare, [place] Health Choice, Veterans' Benefits)  Household income should be no more than 200% of the poverty level The clinic cannot treat you if you are pregnant or think you are pregnant  Sexually transmitted diseases are not treated at the clinic.    Dental Care: Organization         Address  Phone  Notes  [facility] Department of [facility] [place] [phone] Accepts children up to age 40 who are enrolled in [place] or [place]; pregnant women with a Medicaid card; and children who have applied for Medicaid or  Health Choice, but were declined, whose parents can pay a reduced fee at time of service.  [place]  [address], [facility] [phone] Accepts children up to age 43 who are enrolled in Medicaid or [place]; pregnant women with a Medicaid card; and children who have applied for Medicaid or [place] Health Choice, but were declined, whose parents can pay a reduced fee at time of service.  [place] Adult Dental Access PROGRAM  [place] [phone] Patients are seen by appointment only. Walk-ins are not accepted. [place] will see patients 29 years of age and older. Monday - Tuesday (8am-5pm) Most Wednesdays (8:30-5pm) $30 per visit, cash only  [facility] Adult Dental Access PROGRAM  [address], [facility] [phone] Patients are seen by appointment only. Walk-ins are not accepted. [place] will see patients 45 years of age and older. One Wednesday Evening (Monthly: Volunteer Based).  $30 per visit, cash only  [place]  [phone] for adults; Children under age 67, call Graduate Pediatric Dentistry at [phone]. Children aged 74-14, please call [phone] to request a pediatric application.  Dental services are provided  in all areas of dental care including fillings, crowns and bridges, complete and partial dentures, implants, gum treatment, root canals, and extractions. Preventive care is also provided. Treatment is provided to both adults and children. Patients are selected via a lottery and there is often a waiting list.   [facility] [address] [place]  [phone] [email or web address]   [facility] [address] [place], [place] [phone], Ext. [phone] Second and Fourth Thursday of each month, opens at 6:30 AM; Clinic ends at 9 AM.  Patients are seen on a first-come first-served basis, and a limited number are seen during each clinic.   [facility]  [address] [place], [place] [phone]   Eligibility Requirements You must have lived in [place], [place], or [place] counties for at least the last three months.   You cannot be eligible for state or federal sponsored [employer], including [employer], [place], or [employer].   You generally cannot be eligible for healthcare insurance through your employer.    How to apply: Eligibility screenings are held every Tuesday and Wednesday afternoon from 1:00 pm until 4:00 pm. You do not need an appointment for the interview!  [facility] [address], [place], [place]   [place]  [place] Department  [place]  [phone]    Behavioral Health Resources in the Community: Intensive Outpatient Programs Organization         Address  Phone  Notes  [place]. [address], [place], [place] [phone]   [facility] [address] [place], [place]   ADS: Alcohol & Drug S[facility] [address] [place], [place]   [place] [address],  [place], [place] or [phone]   Substance Abuse Resources Organization         Address  Phone  Notes  Alcohol and Drug Services  [phone]   [place]  [phone]   [facility] [place]   Chinita Pester  [phone]   Residential & Outpatient Substance Abuse Program  [phone]   Psychological Services Organization         Address  Phone  Notes  [place]  [place]  Services  [place] [address], [place] or [phone]    Mobile Crisis Teams Organization         Address  Phone  Notes  [facility], Mobile Crisis Care Unit  [phone]   [facility]  [address] [place], [place]   [place] [address], [place] [phone]    Self-Help/Support Groups Organization         Address  Phone             Notes  [place]. of [place] - variety of support groups  [place] Call for more information  Narcotics Anonymous (NA), [facility] [address], [employer] [place]  2 meetings at this location   Special educational needs teacher         Address  Phone  Notes  [facility]AP Residential Treatment [place],    [place]  [phone]   [facility]  [address], [place] [phone], [place], [place]   [place], [place] [phone] Admissions: 8am-3pm M-F  Incentives Substance [place] [address]    [place], [place] [phone]   [facility] [address] [place], [place], [place]   [facility] [address],  [place], [place]   [facility] Programs - Intensive Outpatient [address]een [address], [place], [place]   [facility] ([place].,  [place], [place] [phone] or  [phone]   [facility]idential [facility] Services (RTS) [address] [place], [place] Accepts Medicaid  [facility] [place] [address]  [place] [phone] Substance Abuse/Addiction Treatment   [facility] Organization         Address  Phone  Notes  [facility]  [phone]   Domenic Schwab, PhD [address] [place], [place]   [phone] or [phone]   [place], [place] [phone]   [facility] [address] [place], [place] [phone] Insurance/Medicaid/sponsorship through [facility] and Families [address], [address] [place]                                    [place], [place] [phone] [place] [address], [place] [phone]    Dr. Adele Schilder  [phone]   [facility] [place] Dept. 1) [address], [place] 2) [place] 3)  [address], [place] [phone]  [phone]   [place] [phone] or [phone] (After Hours)

## 2015-06-02 NOTE — ED Provider Notes (Signed)
CSN: [id number]     Arrival date & time 06/02/15  1417 History   First MD Initiated Contact with Patient 06/02/15 1446     Chief Complaint  Patient presents with  . Wrist Pain  . Knee Pain     (Consider location/radiation/quality/duration/timing/severity/associated sxs/prior Treatment) HPI   Blood pressure 118/70, pulse 82, temperature 98.4 F (36.9 C), temperature source Oral, resp. rate 18, height 5\' 11"  (1.803 m), weight 152 lb (68.947 kg), SpO2 99 %.  Evan Gallegos is a 59 y.o. male complaining of right (dominant) wrist and right knee pain intermittently over the last several months worsening over the last several weeks, he rates his pain at 8 out of 10. He's taken Advil at home with little relief. Ease and relating with a cane starting 2 weeks ago which is atypical for him. He does not have a primary care physician or orthopedic follow-up. States that there is swelling to the knee but he has full range of motion, no fevers or chills.  Past Medical History  Diagnosis Date  . Prostate enlargement   . Abdominal pain, other specified site     crhon's disease  . Crohn's disease    Past Surgical History  Procedure Laterality Date  . Small intestine surgery     No family history on file. History  Substance Use Topics  . Smoking status: Current Every Day Smoker -- 0.50 packs/day    Types: Cigarettes  . Smokeless tobacco: Not on file  . Alcohol Use: No    Review of Systems  10 systems reviewed and found to be negative, except as noted in the HPI.   Allergies  Penicillins  Home Medications   Prior to Admission medications   Medication Sig Start Date End Date Taking? Authorizing Provider  potassium chloride (K-DUR) 10 MEQ tablet Take 2 tablets (20 mEq total) by mouth daily. 09/25/13   Lucila [place], PA-C  predniSONE (DELTASONE) 10 MG tablet 40 mg for 3 days (4 tablets)  30 mg for 7 days (3 tablets) 20 mg for 7 days (2 tablets) 10 mg for 7 days (1 tablet) 5 mg for 7  days (half a tablet) 09/25/13   Lucila [place], PA-C  predniSONE (DELTASONE) 5 MG tablet Take 5 mg by mouth daily.    Historical Provider, MD  Tamsulosin HCl (FLOMAX) 0.4 MG CAPS Take 0.4 mg by mouth.    Historical Provider, MD   BP 116/71 mmHg  Pulse 65  Temp(Src) 98.1 F (36.7 C) (Oral)  Resp 16  Ht 5\' 11"  (1.803 m)  Wt 152 lb (68.947 kg)  BMI 21.21 kg/m2  SpO2 98% Physical Exam  Constitutional: He is oriented to person, place, and time. He appears well-developed and well-nourished. No distress.  HENT:  Head: Normocephalic and atraumatic.  Mouth/Throat: Oropharynx is clear and moist.  Eyes: Conjunctivae and EOM are normal. Pupils are equal, round, and reactive to light.  Neck: Normal range of motion.  Cardiovascular: Normal rate, regular rhythm and intact distal pulses.   Pulmonary/Chest: Effort normal and breath sounds normal.  Abdominal: Soft. Bowel sounds are normal. There is no tenderness.  Musculoskeletal: Normal range of motion. He exhibits edema and tenderness.  Right knee with mild to moderate effusion and trace warmth, full range of motion positive crepitance, he is distally neurovascularly intact. Patient is tender to palpation along the dorsum of the right wrist on the ulnar side, no snuffbox tenderness.  Neurological: He is alert and oriented to person, place, and time.  Skin: He is not diaphoretic.  Psychiatric: He has a normal mood and affect.  Nursing note and vitals reviewed.   ED Course  Procedures (including critical care time) Labs Review Labs Reviewed - No data to display  Imaging Review No results found.   EKG Interpretation None      MDM   Final diagnoses:  Right knee pain  Right wrist pain    Filed Vitals:   06/02/15 1426 06/02/15 1549  BP: 116/71 118/70  Pulse: 65 82  Temp: 98.1 F (36.7 C) 98.4 F (36.9 C)  TempSrc: Oral Oral  Resp: 16 18  Height: 5\' 11"  (1.803 m)   Weight: 152 lb (68.947 kg)   SpO2: 98% 99%    Evan Gallegos  is a pleasant 59 y.o. male presenting with laceration of chronic right knee and right wrist pain, this is likely secondary to osteoarthritis. No signs of septic joint. Patient will be given Vicodin, knee sleeve, wrist splint and encouraged to follow closely with sports medicine.  Evaluation does not show pathology that would require ongoing emergent intervention or inpatient treatment. Pt is hemodynamically stable and mentating appropriately. Discussed findings and plan with patient/guardian, who agrees with care plan. All questions answered. Return precautions discussed and outpatient follow up given.   Discharge Medication List as of 06/02/2015  3:25 PM    START taking these medications   Details  HYDROcodone-acetaminophen (NORCO/VICODIN) 5-325 MG per tablet Take 1-2 tablets by mouth every 6 hours as needed for pain and/or cough., Print             [place], PA-C 06/02/15 1621  Evelina Bucy, MD 06/03/15 1302

## 2015-06-02 NOTE — ED Notes (Addendum)
Right wrist and knee pain for a couple of months. No injury. States he would like to get a Rx for pain medication.

## 2015-07-03 ENCOUNTER — Encounter (INDEPENDENT_AMBULATORY_CARE_PROVIDER_SITE_OTHER): Payer: Self-pay

## 2015-07-03 ENCOUNTER — Encounter: Payer: Self-pay | Admitting: Family Medicine

## 2015-07-03 ENCOUNTER — Ambulatory Visit (INDEPENDENT_AMBULATORY_CARE_PROVIDER_SITE_OTHER): Payer: Medicare Other | Admitting: Family Medicine

## 2015-07-03 VITALS — BP 120/79 | HR 71 | Ht 71.0 in | Wt 150.0 lb

## 2015-07-03 DIAGNOSIS — M25561 Pain in right knee: Secondary | ICD-10-CM | POA: Diagnosis not present

## 2015-07-03 DIAGNOSIS — M25531 Pain in right wrist: Secondary | ICD-10-CM | POA: Diagnosis not present

## 2015-07-03 MED ORDER — DICLOFENAC SODIUM 75 MG PO TBEC: 75.0000 mg | DELAYED_RELEASE_TABLET | Freq: Two times a day (BID) | ORAL | Status: AC

## 2015-07-03 NOTE — Patient Instructions (Signed)
Your knee pain and swelling are due to arthritis Take tylenol 500mg  1-2 tabs three times a day for pain. Diclofenac twice a day with food for pain and inflammation. Glucosamine sulfate 750mg  twice a day is a supplement that may help. Capsaicin, aspercreme, or biofreeze topically up to four times a day may also help with pain. Cortisone injections are an option - call me if you're not improving and you want to try one of these. If cortisone injections do not help, there are different types of shots that may help but they take longer to take effect. It's important that you continue to stay active. Straight leg raises, knee extensions 3 sets of 10 once a day (add ankle weight if these become too easy). Consider physical therapy to strengthen muscles around the joint that hurts to take pressure off of the joint itself. Shoe inserts with good arch support may be helpful. Walker or cane if needed. Ice 15 minutes at a time 3-4 times a day as needed to help with pain.  Your wrist pain is due to an extensor tendinitis. Ice the wrist 15 minutes at a time 3-4 times a day. Wear the wrist brace at all times except when bathing, icing for next 6 weeks to help rest this. Take diclofenac as noted above. Follow up with me in 1 month to 6 weeks.

## 2015-07-07 DIAGNOSIS — M25561 Pain in right knee: Secondary | ICD-10-CM | POA: Insufficient documentation

## 2015-07-07 DIAGNOSIS — M25531 Pain in right wrist: Secondary | ICD-10-CM | POA: Insufficient documentation

## 2015-07-07 NOTE — Progress Notes (Signed)
PCP: No primary care provider on file.  Subjective:   HPI: Patient is a 59 y.o. male here for right knee and wrist pain.  Patient reports 3-4 months of both right knee and wrist pain. No known injury/trauma to either. Swelling of right knee. Pain level 7/10. No catching, locking, giving out. Occasionally using a brace, ace wrap. No medications. Pain in right wrist is dorsal and preceded the knee pain. Worse with extension of the wrist. Pain here also 7/10.  Past Medical History  Diagnosis Date  . Prostate enlargement   . Abdominal pain, other specified site     crhon's disease  . Crohn's disease     Current Outpatient Prescriptions on File Prior to Visit  Medication Sig Dispense Refill  . potassium chloride (K-DUR) 10 MEQ tablet Take 2 tablets (20 mEq total) by mouth daily. 3 tablet 0  . predniSONE (DELTASONE) 5 MG tablet Take 5 mg by mouth daily.    . Tamsulosin HCl (FLOMAX) 0.4 MG CAPS Take 0.4 mg by mouth.     No current facility-administered medications on file prior to visit.    Past Surgical History  Procedure Laterality Date  . Small intestine surgery      Allergies  Allergen Reactions  . Penicillins     History   Social History  . Marital Status: Married    Spouse Name: N/A  . Number of Children: N/A  . Years of Education: N/A   Occupational History  . Not on file.   Social History Main Topics  . Smoking status: Current Every Day Smoker -- 0.50 packs/day    Types: Cigarettes  . Smokeless tobacco: Not on file  . Alcohol Use: No  . Drug Use: Not on file  . Sexual Activity: Not on file   Other Topics Concern  . Not on file   Social History Narrative    No family history on file.  BP 120/79 mmHg  Pulse 71  Ht 5\' 11"  (1.803 m)  Wt 150 lb (68.04 kg)  BMI 20.93 kg/m2  Review of Systems: See HPI above.    Objective:  Physical Exam:  Gen: NAD  Right knee: Mod effusion.  No bruising, other deformity. Mild TTP medial > lateral joint  lines. FROM. Negative ant/post drawers. Negative valgus/varus testing. Negative lachmanns. Negative mcmurrays, apleys, patellar apprehension. NV intact distally.  Right wrist: No gross deformity, swelling, bruising. TTP distal ulna over extensor carpi ulnaris.  No other tenderness. FROM with pain on extension of wrist only. NVI distally. Negative tinels, finkelsteins.    Assessment & Plan:  1. Right knee pain - with effusion.  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  Recommended aspiration and injection, sending aspirate to the lab but he declined for now - wants to try medications, home exercises first.  Diclofenac, tylenol, glucosamine, topical medications.  Icing, compression, elevation.  F/u in 1 month to 6 weeks - call sooner if he would like to try aspiration/injection.  2. Right wrist pain - 2/2 extensor carpi ulnaris tendinitis.  Icing, wrist brace, diclofenac.  F/u in 1 month to 6 weeks.

## 2015-07-07 NOTE — Assessment & Plan Note (Signed)
2/2 extensor carpi ulnaris tendinitis.  Icing, wrist brace, diclofenac.  F/u in 1 month to 6 weeks.

## 2015-07-07 NOTE — Assessment & Plan Note (Signed)
with effusion.  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  Recommended aspiration and injection, sending aspirate to the lab but he declined for now - wants to try medications, home exercises first.  Diclofenac, tylenol, glucosamine, topical medications.  Icing, compression, elevation.  F/u in 1 month to 6 weeks - call sooner if he would like to try aspiration/injection.

## 2015-07-24 ENCOUNTER — Emergency Department (HOSPITAL_BASED_OUTPATIENT_CLINIC_OR_DEPARTMENT_OTHER)
Admission: EM | Admit: 2015-07-24 | Discharge: 2015-07-25 | Disposition: A | Discharge status: Home / Self Care | Payer: Medicare Other | Attending: Emergency Medicine | Admitting: Emergency Medicine

## 2015-07-24 ENCOUNTER — Encounter (HOSPITAL_BASED_OUTPATIENT_CLINIC_OR_DEPARTMENT_OTHER): Payer: Self-pay | Admitting: *Deleted

## 2015-07-24 ENCOUNTER — Emergency Department (HOSPITAL_BASED_OUTPATIENT_CLINIC_OR_DEPARTMENT_OTHER): Payer: Medicare Other

## 2015-07-24 DIAGNOSIS — Z880 Allergy status to penicillin: Secondary | ICD-10-CM | POA: Insufficient documentation

## 2015-07-24 DIAGNOSIS — J45909 Unspecified asthma, uncomplicated: Secondary | ICD-10-CM | POA: Insufficient documentation

## 2015-07-24 DIAGNOSIS — K5090 Crohn's disease, unspecified, without complications: Secondary | ICD-10-CM | POA: Insufficient documentation

## 2015-07-24 DIAGNOSIS — Z8669 Personal history of other diseases of the nervous system and sense organs: Secondary | ICD-10-CM | POA: Insufficient documentation

## 2015-07-24 DIAGNOSIS — Z79899 Other long term (current) drug therapy: Secondary | ICD-10-CM | POA: Diagnosis not present

## 2015-07-24 DIAGNOSIS — Z720 Tobacco use: Secondary | ICD-10-CM | POA: Insufficient documentation

## 2015-07-24 DIAGNOSIS — Z87438 Personal history of other diseases of male genital organs: Secondary | ICD-10-CM | POA: Insufficient documentation

## 2015-07-24 DIAGNOSIS — R197 Diarrhea, unspecified: Secondary | ICD-10-CM | POA: Diagnosis present

## 2015-07-24 DIAGNOSIS — K50919 Crohn's disease, unspecified, with unspecified complications: Secondary | ICD-10-CM

## 2015-07-24 HISTORY — DX: Multiple myeloma not having achieved remission: C90.00

## 2015-07-24 HISTORY — DX: Unspecified asthma, uncomplicated: J45.909

## 2015-07-24 LAB — LIPASE, BLOOD: Lipase: 15 U/L — ABNORMAL LOW (ref 22–51)

## 2015-07-24 LAB — COMPREHENSIVE METABOLIC PANEL
ALK PHOS: 88 U/L (ref 38–126)
ALT: 9 U/L — AB (ref 17–63)
AST: 15 U/L (ref 15–41)
Albumin: 3.1 g/dL — ABNORMAL LOW (ref 3.5–5.0)
Anion gap: 10 (ref 5–15)
BUN: 26 mg/dL — ABNORMAL HIGH (ref 6–20)
CHLORIDE: 107 mmol/L (ref 101–111)
CO2: 23 mmol/L (ref 22–32)
Calcium: 8.8 mg/dL — ABNORMAL LOW (ref 8.9–10.3)
Creatinine, Ser: 1.64 mg/dL — ABNORMAL HIGH (ref 0.61–1.24)
GFR calc Af Amer: 52 mL/min — ABNORMAL LOW (ref >60)
GFR calc non Af Amer: 45 mL/min — ABNORMAL LOW (ref >60)
Glucose, Bld: 104 mg/dL — ABNORMAL HIGH (ref 65–99)
Potassium: 3.7 mmol/L (ref 3.5–5.1)
Sodium: 140 mmol/L (ref 135–145)
Total Bilirubin: 0.4 mg/dL (ref 0.3–1.2)
Total Protein: 7.2 g/dL (ref 6.5–8.1)

## 2015-07-24 LAB — URINALYSIS, ROUTINE W REFLEX MICROSCOPIC
BILIRUBIN URINE: NEGATIVE
GLUCOSE, UA: NEGATIVE mg/dL
HGB URINE DIPSTICK: NEGATIVE
Ketones, ur: NEGATIVE mg/dL
Leukocytes, UA: NEGATIVE
NITRITE: NEGATIVE
PH: 6 (ref 5.0–8.0)
Protein, ur: 100 mg/dL — AB
Specific Gravity, Urine: 1.03 (ref 1.005–1.030)
Urobilinogen, UA: 0.2 mg/dL (ref 0.0–1.0)

## 2015-07-24 LAB — URINE MICROSCOPIC-ADD ON

## 2015-07-24 LAB — CBC WITH DIFFERENTIAL/PLATELET
BASOS ABS: 0 10*3/uL (ref 0.0–0.1)
Basophils Relative: 0 % (ref 0–1)
Eosinophils Absolute: 0.1 10*3/uL (ref 0.0–0.7)
Eosinophils Relative: 1 % (ref 0–5)
HEMATOCRIT: 35.6 % — AB (ref 39.0–52.0)
HEMOGLOBIN: 11.8 g/dL — AB (ref 13.0–17.0)
LYMPHS PCT: 32 % (ref 12–46)
Lymphs Abs: 2.6 10*3/uL (ref 0.7–4.0)
MCH: 25.6 pg — ABNORMAL LOW (ref 26.0–34.0)
MCHC: 33.1 g/dL (ref 30.0–36.0)
MCV: 77.2 fL — ABNORMAL LOW (ref 78.0–100.0)
MONO ABS: 1.1 10*3/uL — AB (ref 0.1–1.0)
Monocytes Relative: 14 % — ABNORMAL HIGH (ref 3–12)
NEUTROS PCT: 53 % (ref 43–77)
Neutro Abs: 4.4 10*3/uL (ref 1.7–7.7)
Platelets: 398 10*3/uL (ref 150–400)
RBC: 4.61 MIL/uL (ref 4.22–5.81)
RDW: 17.2 % — ABNORMAL HIGH (ref 11.5–15.5)
WBC: 8.1 10*3/uL (ref 4.0–10.5)

## 2015-07-24 MED ORDER — ONDANSETRON HCL 4 MG/2ML IJ SOLN
4.0000 mg | Freq: Once | INTRAMUSCULAR | Status: AC
  Administered 2015-07-24: 4 mg via INTRAVENOUS
  Filled 2015-07-24: qty 2

## 2015-07-24 MED ORDER — HYDROCODONE-ACETAMINOPHEN 5-325 MG PO TABS: 1.0000 | ORAL_TABLET | ORAL | Status: DC | PRN

## 2015-07-24 MED ORDER — HYDROMORPHONE HCL 1 MG/ML IJ SOLN
1.0000 mg | Freq: Once | INTRAMUSCULAR | Status: AC
Start: 2015-07-24 — End: 2015-07-24
  Administered 2015-07-24: 1 mg via INTRAVENOUS
  Filled 2015-07-24: qty 1

## 2015-07-24 MED ORDER — IOHEXOL 300 MG/ML  SOLN: 25.0000 mL | Freq: Once | INTRAMUSCULAR | Status: DC | PRN

## 2015-07-24 MED ORDER — PREDNISONE 20 MG PO TABS: ORAL_TABLET | ORAL | Status: DC

## 2015-07-24 MED ORDER — IOHEXOL 300 MG/ML  SOLN
50.0000 mL | Freq: Once | INTRAMUSCULAR | Status: AC | PRN
  Administered 2015-07-24: 50 mL via ORAL

## 2015-07-24 MED ORDER — ONDANSETRON 4 MG PO TBDP: 4.0000 mg | ORAL_TABLET | ORAL | Status: DC | PRN

## 2015-07-24 MED ORDER — IOHEXOL 300 MG/ML  SOLN
100.0000 mL | Freq: Once | INTRAMUSCULAR | Status: AC | PRN
  Administered 2015-07-24: 100 mL via INTRAVENOUS

## 2015-07-24 MED ORDER — SODIUM CHLORIDE 0.9 % IV BOLUS (SEPSIS)
1000.0000 mL | Freq: Once | INTRAVENOUS | Status: AC
  Administered 2015-07-24: 1000 mL via INTRAVENOUS

## 2015-07-24 NOTE — Discharge Instructions (Signed)
Crohn Disease  Crohn disease is a long-term (chronic) soreness and redness (inflammation) of the intestines (bowel). It can affect any portion of the digestive tract, from the mouth to the anus. It can also cause problems outside the digestive tract. Crohn disease is closely related to a disease called ulcerative colitis (together, these two diseases are called inflammatory bowel disease).   CAUSES   The cause of Crohn disease is not known. One theory is that, in an easily affected person, the immune system is triggered to attack the body's own digestive tissue. Crohn disease runs in families. It seems to be more common in certain geographic areas and amongst certain races. There are no clear-cut dietary causes.   SYMPTOMS   Crohn disease can cause many different symptoms since it can affect many different parts of the body. Symptoms include:  · Fatigue.  · Weight loss.  · Chronic diarrhea, sometime bloody.  · Abdominal pain and cramps.  · Fever.  · Ulcers or canker sores in the mouth or rectum.  · Anemia (low red blood cells).  · Arthritis, skin problems, and eye problems may occur.  Complications of Crohn disease can include:  · Series of holes (perforation) of the bowel.  · Portions of the intestines sticking to each other (adhesions).  · Obstruction of the bowel.  · Fistula formation, typically in the rectal area but also in other areas. A fistula is an opening between the bowels and the outside, or between the bowels and another organ.  · A painful crack in the mucous membrane of the anus (rectal fissure).  DIAGNOSIS   Your caregiver may suspect Crohn disease based on your symptoms and an exam. Blood tests may confirm that there is a problem. You may be asked to submit a stool specimen for examination. X-rays and CT scans may be necessary. Ultimately, the diagnosis is usually made after a procedure that uses a flexible tube that is inserted via your mouth or your anus. This is done under sedation and is called  either an upper endoscopy or colonoscopy. With these tests, the specialist can take tiny tissue samples and remove them from the inside of the bowel (biopsy). Examination of this biopsy tissue under a microscope can reveal Crohn disease as the cause of your symptoms.  Due to the many different forms that Crohn disease can take, symptoms may be present for several years before a diagnosis is made.  TREATMENT   Medications are often used to decrease inflammation and control the immune system. These include medicines related to aspirin, steroid medications, and newer and stronger medications to slow down the immune system. Some medications may be used as suppositories or enemas. A number of other medications are used or have been studied. Your caregiver will make specific recommendations.  HOME CARE INSTRUCTIONS   · Symptoms such as diarrhea can be controlled with medications. Avoid foods that have a laxative effect such as fresh fruit, vegetables, and dairy products. During flare-ups, you can rest your bowel by refraining from solid foods. Drink clear liquids frequently during the day. (Electrolyte or rehydrating fluids are best. Your caregiver can help you with suggestions.) Drink often to prevent loss of body fluids (dehydration). When diarrhea has cleared, eat small meals and more frequently. Avoid food additives and stimulants such as caffeine (coffee, tea, or chocolate). Enzyme supplements may help if you develop intolerance to a sugar in dairy products (lactose). Ask your caregiver or dietitian about specific dietary instructions.  · Try to   maintain a positive attitude. Learn relaxation techniques such as self-hypnosis, mental imaging, and muscle relaxation.  · If possible, avoid stresses which can aggravate your condition.  · Exercise regularly.  · Follow your diet.  · Always get plenty of rest.  SEEK MEDICAL CARE IF:   · Your symptoms fail to improve after a week or two of new treatment.  · You experience  continued weight loss.  · You have ongoing cramps or loose bowels.  · You develop a new skin rash, skin sores, or eye problems.  SEEK IMMEDIATE MEDICAL CARE IF:   · You have worsening of your symptoms or develop new symptoms.  · You have a fever.  · You develop bloody diarrhea.  · You develop severe abdominal pain.  MAKE SURE YOU:   · Understand these instructions.  · Will watch your condition.  · Will get help right away if you are not doing well or get worse.  Document Released: 09/08/2005 Document Revised: 04/15/2014 Document Reviewed: 08/07/2007  [employer]® Patient Information ©2015 [employer]. This information is not intended to replace advice given to you by your health care provider. Make sure you discuss any questions you have with your health care provider.

## 2015-07-24 NOTE — ED Provider Notes (Signed)
CSN: [id number]   Arrival date & time 07/24/15 1740  History  This chart was scribed for  Evan Shanks, MD by Altamease Oiler, ED Scribe. This patient was seen in room MH03/MH03 and the patient's care was started at 7:22 PM.  Chief Complaint  Patient presents with  . Diarrhea  . Emesis    HPI The history is provided by the patient and the spouse. No language interpreter was used.   Evan Gallegos is a 59 y.o. male with PMHx of Crohn's disease who presents to the Emergency Department complaining of non-bloody diarrhea with onset this morning. Associated symptoms include fatigue, sweating for 2 days, chills, nausea, 1 episode of emesis, intermittent cramping abdominal pain, and cramping leg pain. Pt denies fever, dysuria, decreased urinary frequency, rash, cough, SOB, and chest pain. Apart from the leg cramping these symptoms are typical of his Crohn's disease. He had similar symptoms recently that improved after Prednisone 1 week ago. No known sick contact. His last abdominal imaging was 1 year ago. No history of pneumonia or other lung disease. He had a scheduled PCP appointment on 08/02/15. Past surgical history is significant for small intestine surgery and colostomy reversal 1 year ago.   Past Medical History  Diagnosis Date  . Prostate enlargement   . Abdominal pain, other specified site     crhon's disease  . Crohn's disease   . Asthma   . Multiple myeloma     Past Surgical History  Procedure Laterality Date  . Small intestine surgery      No family history on file.  Social History  Substance Use Topics  . Smoking status: Current Every Day Smoker -- 0.50 packs/day    Types: Cigarettes  . Smokeless tobacco: None  . Alcohol Use: No     Review of Systems  Constitutional: Positive for chills, diaphoresis and fatigue. Negative for fever.  Respiratory: Negative for cough and shortness of breath.   Gastrointestinal: Positive for nausea, vomiting, abdominal pain and diarrhea.   Genitourinary: Negative for dysuria, urgency, decreased urine volume and difficulty urinating.  Skin: Negative for rash.  10 Systems reviewed and are negative for acute change except as noted in the HPI.    Home Medications   Prior to Admission medications   Medication Sig Start Date End Date Taking? Authorizing Provider  azaTHIOprine (IMURAN) 50 MG tablet Take 150 mg by mouth. 11/20/14   Historical Provider, MD  diclofenac (VOLTAREN) 75 MG EC tablet Take 1 tablet (75 mg total) by mouth 2 (two) times daily. 07/03/15   Dene Gentry, MD  HYDROcodone-acetaminophen (NORCO/VICODIN) 5-325 MG per tablet Take 1-2 tablets by mouth every 4 (four) hours as needed for moderate pain or severe pain. 07/24/15   Evan Shanks, MD  ondansetron (ZOFRAN ODT) 4 MG disintegrating tablet Take 1 tablet (4 mg total) by mouth every 4 (four) hours as needed for nausea or vomiting. 07/24/15   Evan Shanks, MD  potassium chloride (K-DUR) 10 MEQ tablet Take 2 tablets (20 mEq total) by mouth daily. 09/25/13   Lucila Maine, PA-C  predniSONE (DELTASONE) 20 MG tablet 2 tabs po daily x 2 days, then 1.5 tabs x 3 days, then 1 tabs x 7 days 07/24/15   Evan Shanks, MD  predniSONE (DELTASONE) 5 MG tablet Take 5 mg by mouth daily.    Historical Provider, MD  Tamsulosin HCl (FLOMAX) 0.4 MG CAPS Take 0.4 mg by mouth.    Historical Provider, MD  vitamin B-12 (CYANOCOBALAMIN) 1000 MCG tablet  Take 1,000 mcg by mouth.    Historical Provider, MD    Allergies  Penicillins  Triage Vitals: BP 117/80 mmHg  Pulse 69  Temp(Src) 98.9 F (37.2 C) (Oral)  Resp 18  Ht 5' 11"  (1.803 m)  Wt 150 lb (68.04 kg)  BMI 20.93 kg/m2  SpO2 98%  Physical Exam  Constitutional: He is oriented to person, place, and time. He appears well-developed and well-nourished.  Patient is slender, nontoxic and alert and not in acute distress.  HENT:  Head: Normocephalic and atraumatic.  Eyes: EOM are normal. No scleral icterus.  Neck: Neck supple.   Cardiovascular: Normal rate, regular rhythm, normal heart sounds and intact distal pulses.   Pulmonary/Chest: Effort normal and breath sounds normal.  Abdominal: Soft. Bowel sounds are normal. He exhibits no distension. There is tenderness (mild to moderate suprapubic tenderness. No guarding no peritoneal signs.).  Musculoskeletal: Normal range of motion. He exhibits no edema.  Neurological: He is alert and oriented to person, place, and time. He has normal strength. Coordination normal. GCS eye subscore is 4. GCS verbal subscore is 5. GCS motor subscore is 6.  Skin: Skin is warm, dry and intact. No rash noted.  Psychiatric: He has a normal mood and affect.    ED Course  Procedures   DIAGNOSTIC STUDIES: Oxygen Saturation is 98% on RA, normal by my interpretation.    COORDINATION OF CARE: 7:27 PM Discussed treatment plan which includes lab work, CT A/P with contrast, Dilaudid, and IVF with pt at bedside and pt agreed to plan.  Labs Review-  Labs Reviewed  URINALYSIS, ROUTINE W REFLEX MICROSCOPIC (NOT AT [facility]) - Abnormal; Notable for the following:    Protein, ur 100 (*)    All other components within normal limits  COMPREHENSIVE METABOLIC PANEL - Abnormal; Notable for the following:    Glucose, Bld 104 (*)    BUN 26 (*)    Creatinine, Ser 1.64 (*)    Calcium 8.8 (*)    Albumin 3.1 (*)    ALT 9 (*)    GFR calc non Af Amer 45 (*)    GFR calc Af Amer 52 (*)    All other components within normal limits  LIPASE, BLOOD - Abnormal; Notable for the following:    Lipase 15 (*)    All other components within normal limits  CBC WITH DIFFERENTIAL/PLATELET - Abnormal; Notable for the following:    Hemoglobin 11.8 (*)    HCT 35.6 (*)    MCV 77.2 (*)    MCH 25.6 (*)    RDW 17.2 (*)    Monocytes Relative 14 (*)    Monocytes Absolute 1.1 (*)    All other components within normal limits  URINE MICROSCOPIC-ADD ON    Imaging Review Ct Abdomen Pelvis W Contrast  07/24/2015   CLINICAL  DATA:  Acute onset of diarrhea, fatigue, diaphoresis, chills and nausea. Vomiting. Intermittent cramping abdominal pain and neck pain. Current history of Crohn's disease. Initial encounter.  EXAM: CT ABDOMEN AND PELVIS WITH CONTRAST  TECHNIQUE: Multidetector CT imaging of the abdomen and pelvis was performed using the standard protocol following bolus administration of intravenous contrast.  CONTRAST:  148m OMNIPAQUE IOHEXOL 300 MG/ML  SOLN  COMPARISON:  Abdominal radiograph performed 01/16/2013  FINDINGS: The visualized lung bases are clear.  The patient is status post resection of the distal small bowel and ascending colon, and partial resection at the sigmoid colon. There is dilatation of the remaining distal small bowel to 7.1  cm. This extends to the level of the ileocolic anastomosis. Trace contrast is noted within the proximal colon. This may reflect some degree of dysmotility. Obstruction is considered unlikely given residual air in the colon.  Nonspecific masslike density is noted at the sigmoid colon adjacent to the suture line, measuring approximately 4.2 x 2.2 cm. An underlying mass cannot be excluded. Would correlate with the patient's history. The remainder of the colon is normal in caliber and contains air, without evidence for obstruction.  An 8 mm hypodensity is noted within the right hepatic lobe, and a 7 mm hypodensity is noted more inferiorly. These are nonspecific. The liver and spleen are otherwise unremarkable. The gallbladder is within normal limits. The pancreas and adrenal glands are unremarkable.  The kidneys are unremarkable in appearance. There is no evidence of hydronephrosis. No renal or ureteral stones are seen. No perinephric stranding is appreciated.  The stomach is within normal limits. No acute vascular abnormalities are seen. Scattered calcification is noted along the abdominal aorta and its branches.  The bladder is mildly distended and grossly unremarkable. The prostate is  borderline normal in size. A 1.6 cm hypodensity within the prostate is nonspecific. An underlying mass cannot be excluded. No inguinal lymphadenopathy is seen.  A large right-sided hydrocele is noted.  No acute osseous abnormalities are identified.  IMPRESSION: 1. Status post resection of distal small bowel and ascending colon, and partial resection of the sigmoid colon. Dilatation of the remaining distal small bowel to 7.1 cm. This extends to the ileocolic anastomosis, with trace contrast in the proximal colon. This likely reflects some degree of small bowel dysmotility. Obstruction is considered unlikely. 2. Nonspecific masslike density at the sigmoid colon adjacent to the suture line, measuring 4.2 x 2.2 cm. Underlying mass cannot be excluded. Would correlate with the patient's history, and assess further with colonoscopy when deemed clinically appropriate. 3. Nonspecific hypodensities within the right hepatic lobe, measuring up to 8 mm. 4. Scattered calcification along the abdominal aorta and its branches. 5. Nonspecific 1.6 cm hypodensity within the prostate. A mass cannot be excluded. Would correlate with PSA. 6. Large right-sided hydrocele noted.   Electronically Signed   By: Garald Balding M.D.   On: 07/24/2015 22:17    EKG Interpretation None     MDM   Final diagnoses:  Crohn disease, unspecified complication   Patient presents after treatment for a Crohn's exacerbation approximately 2 weeks ago. He did express improvement after taking prednisone. He reports his physician recommended he have evaluation the emergency department due to recurrence of diarrheal illness and sweating and chills. Patient does not describe significant pain and does not have peritoneal signs. He describes one episode of vomiting but has continued to take in fluids. CT does not show focal areas of inflammation and describes obstruction is unlikely. Patient has other nonacute findings of nonspecific masslike density and  nonspecific hypodensity in the liver. These findings do not appear emergent. The patient works closely with a gastroenterologist due to his Crohn's and will have continued follow-up and management for these CT findings. The patient reports he had positive response to oral steroid-induced with similar symptoms. At this time he will be reinitiated on a course of prednisone and he is counseled to call his gastroenterologist on the following day for recheck and review of his diagnostic results. He is counseled on signs and symptoms for which return. He does have an upcoming GI appointment on the 20th but is advised he should be rechecked sooner.  Evan Shanks, MD 07/26/15 [phone]

## 2015-07-24 NOTE — ED Notes (Signed)
Diarrhea, vomiting since this am. Leg cramps. States he feels dehydrated.

## 2015-08-06 ENCOUNTER — Ambulatory Visit: Payer: Medicare Other | Admitting: Family Medicine

## 2017-06-10 ENCOUNTER — Encounter (HOSPITAL_BASED_OUTPATIENT_CLINIC_OR_DEPARTMENT_OTHER): Payer: Self-pay | Admitting: Emergency Medicine

## 2017-06-10 ENCOUNTER — Emergency Department (HOSPITAL_BASED_OUTPATIENT_CLINIC_OR_DEPARTMENT_OTHER)
Admission: EM | Admit: 2017-06-10 | Discharge: 2017-06-10 | Disposition: A | Discharge status: Home / Self Care | Payer: Medicare Other | Attending: Emergency Medicine | Admitting: Emergency Medicine

## 2017-06-10 DIAGNOSIS — J45909 Unspecified asthma, uncomplicated: Secondary | ICD-10-CM | POA: Insufficient documentation

## 2017-06-10 DIAGNOSIS — F17210 Nicotine dependence, cigarettes, uncomplicated: Secondary | ICD-10-CM | POA: Insufficient documentation

## 2017-06-10 DIAGNOSIS — Z79899 Other long term (current) drug therapy: Secondary | ICD-10-CM | POA: Insufficient documentation

## 2017-06-10 DIAGNOSIS — K5090 Crohn's disease, unspecified, without complications: Secondary | ICD-10-CM | POA: Diagnosis not present

## 2017-06-10 DIAGNOSIS — R1084 Generalized abdominal pain: Secondary | ICD-10-CM | POA: Diagnosis present

## 2017-06-10 LAB — CBC WITH DIFFERENTIAL/PLATELET
BASOS PCT: 0 %
Basophils Absolute: 0 10*3/uL (ref 0.0–0.1)
EOS ABS: 0.1 10*3/uL (ref 0.0–0.7)
EOS PCT: 1 %
HCT: 33.3 % — ABNORMAL LOW (ref 39.0–52.0)
HEMOGLOBIN: 11.6 g/dL — AB (ref 13.0–17.0)
LYMPHS ABS: 1.7 10*3/uL (ref 0.7–4.0)
Lymphocytes Relative: 23 %
MCH: 30 pg (ref 26.0–34.0)
MCHC: 34.8 g/dL (ref 30.0–36.0)
MCV: 86 fL (ref 78.0–100.0)
MONO ABS: 0.8 10*3/uL (ref 0.1–1.0)
Monocytes Relative: 10 %
Neutro Abs: 4.9 10*3/uL (ref 1.7–7.7)
Neutrophils Relative %: 66 %
Platelets: 264 10*3/uL (ref 150–400)
RBC: 3.87 MIL/uL — ABNORMAL LOW (ref 4.22–5.81)
RDW: 15.4 % (ref 11.5–15.5)
WBC: 7.5 10*3/uL (ref 4.0–10.5)

## 2017-06-10 LAB — COMPREHENSIVE METABOLIC PANEL
ALK PHOS: 73 U/L (ref 38–126)
ALT: 9 U/L — AB (ref 17–63)
AST: 20 U/L (ref 15–41)
Albumin: 3.4 g/dL — ABNORMAL LOW (ref 3.5–5.0)
Anion gap: 8 (ref 5–15)
BUN: 18 mg/dL (ref 6–20)
CALCIUM: 9.1 mg/dL (ref 8.9–10.3)
CHLORIDE: 107 mmol/L (ref 101–111)
CO2: 24 mmol/L (ref 22–32)
Creatinine, Ser: 1.45 mg/dL — ABNORMAL HIGH (ref 0.61–1.24)
GFR calc non Af Amer: 51 mL/min — ABNORMAL LOW (ref >60)
GFR, EST AFRICAN AMERICAN: 59 mL/min — AB (ref >60)
GLUCOSE: 97 mg/dL (ref 65–99)
Potassium: 3.8 mmol/L (ref 3.5–5.1)
SODIUM: 139 mmol/L (ref 135–145)
Total Bilirubin: 0.3 mg/dL (ref 0.3–1.2)
Total Protein: 6.8 g/dL (ref 6.5–8.1)

## 2017-06-10 MED ORDER — ONDANSETRON 4 MG PO TBDP: 4.0000 mg | ORAL_TABLET | Freq: Three times a day (TID) | ORAL | 0 refills | Status: DC | PRN

## 2017-06-10 MED ORDER — PREDNISONE 10 MG PO TABS: 10.0000 mg | ORAL_TABLET | Freq: Every day | ORAL | 0 refills | Status: AC

## 2017-06-10 NOTE — ED Notes (Addendum)
Pt states he does not want labs or imaging, pt refuses to get into a gown and states he only needs a prescription for predisone.

## 2017-06-10 NOTE — ED Provider Notes (Signed)
[place] DEPT [facility] Provider Note   CSN: [id number] Arrival date & time: 06/10/17  1522  By signing my name below, I, Evan Gallegos, attest that this documentation has been prepared under the direction and in the presence of non-physician practitioner, Russo, [place], PA-C. Electronically Signed: Theresia Gallegos, ED Scribe. 06/10/17. 5:29 PM.  History   Chief Complaint Chief Complaint  Patient presents with  . Abdominal Pain   The history is provided by the patient. No language interpreter was used.   HPI Comments: Evan Gallegos is a 61 y.o. male with a PMHx of Crohn's Disease, who presents to the Emergency Department complaining of improving, intermittent right sided abdominal pain onset yesterday. Pt describes pain as dull and achy. Pt reports associated nausea and vomiting yesterday and fatigue today. Pt states he has a hx of similar symptoms and feels that this is a flare up of his Crohn's. Pt requests prednisone and states that is the only thing that makes his symptoms go away completely. Last BM was 4 hours ago and was normal. Pt has tried goody powder with mild relief of his pain. Pt denies fever, diarrhea, blood in stool, hematemesis, CP, urinary frequency, dysuria or any other complaints at this time.  Past Medical History:  Diagnosis Date  . Abdominal pain, other specified site    crhon's disease  . Asthma   . Crohn's disease ([place])   . Multiple myeloma ([place])   . Prostate enlargement     Patient Active Problem List   Diagnosis Date Noted  . Right knee pain 07/07/2015  . Right wrist pain 07/07/2015  . Benign prostatic hypertrophy without urinary obstruction 10/16/2014  . Hydrocele 10/16/2014  . CD (Crohn's disease) ([place]) 12/17/2013  . SBO (small bowel obstruction) ([place]) 01/17/2013  . 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 SMALL INTESTINE SURGERY         Home Medications    Prior to  Admission medications   Medication Sig Start Date End Date Taking? Authorizing Provider  azaTHIOprine (IMURAN) 50 MG tablet Take 150 mg by mouth. 11/20/14   [provider]  diclofenac (VOLTAREN) 75 MG EC tablet Take 1 tablet (75 mg total) by mouth 2 (two) times daily. 07/03/15   Hudnall, Sharyn Lull, MD  HYDROcodone-acetaminophen (NORCO/VICODIN) 5-325 MG per tablet Take 1-2 tablets by mouth every 4 (four) hours as needed for moderate pain or severe pain. 07/24/15   Charlesetta Shanks, MD  ondansetron (ZOFRAN ODT) 4 MG disintegrating tablet Take 1 tablet (4 mg total) by mouth every 8 (eight) hours as needed for nausea or vomiting. 06/10/17   Russo, [place] N, PA-C  potassium chloride (K-DUR) 10 MEQ tablet Take 2 tablets (20 mEq total) by mouth daily. 09/25/13   Phineas Douglas, Carlos American, PA-C  predniSONE (DELTASONE) 10 MG tablet Take 1 tablet (10 mg total) by mouth daily. 06/10/17   Russo, [place] N, PA-C  predniSONE (DELTASONE) 5 MG tablet Take 5 mg by mouth daily.    [provider]  Tamsulosin HCl (FLOMAX) 0.4 MG CAPS Take 0.4 mg by mouth.    [provider]  vitamin B-12 (CYANOCOBALAMIN) 1000 MCG tablet Take 1,000 mcg by mouth.    [provider]    Family History History reviewed. No pertinent family history.  Social History Social History  Substance Use Topics  . Smoking status: Current Every Day Smoker    Packs/day: 0.50    Types: Cigarettes  .  Smokeless tobacco: Never Used  . Alcohol use No     Allergies   Penicillins   Review of Systems Review of Systems  Constitutional: Positive for fatigue. Negative for fever.  HENT: Negative for sore throat.   Eyes: Negative for visual disturbance.  Respiratory: Negative for shortness of breath.   Cardiovascular: Negative for chest pain.  Gastrointestinal: Positive for abdominal pain, nausea and vomiting. Negative for blood in stool and diarrhea.  Genitourinary: Negative for dysuria and frequency.  Musculoskeletal:  Negative for myalgias.  Skin: Negative for rash.  Neurological: Negative for syncope.     Physical Exam Updated Vital Signs BP 130/85 (BP Location: Right Arm)   Pulse 68   Temp 98.3 F (36.8 C) (Oral)   Resp 16   Ht _0  (1.803 m)   Wt 68.9 kg (152 lb)   SpO2 100%   BMI 21.20 kg/m   Physical Exam  Constitutional: He appears well-developed and well-nourished. No distress.  Well appearing.  HENT:  Head: Normocephalic and atraumatic.  Mouth/Throat: Oropharynx is clear and moist.  Eyes: Conjunctivae are normal.  Cardiovascular: Normal rate, regular rhythm and normal heart sounds.  Exam reveals no gallop and no friction rub.   No murmur heard. Pulmonary/Chest: Effort normal and breath sounds normal. No respiratory distress. He has no wheezes. He has no rales.  Abdominal: Soft. Bowel sounds are normal. He exhibits no distension and no mass. There is no tenderness. There is no rebound and no guarding.  No CVA tenderness. Abdomen is non-tender.   Neurological: He is alert.  Skin: Skin is warm.  Psychiatric: He has a normal mood and affect. His behavior is normal.  Nursing note and vitals reviewed.    ED Treatments / Results  DIAGNOSTIC STUDIES: Oxygen Saturation is 100% on RA, normal by my interpretation.   COORDINATION OF CARE: 5:29 PM-Discussed next steps with pt including possible labs and a dose of prednisone. Pt verbalized understanding and is agreeable with the plan.   Labs (all labs ordered are listed, but only abnormal results are displayed) Labs Reviewed  COMPREHENSIVE METABOLIC PANEL - Abnormal; Notable for the following:       Result Value   Creatinine, Ser 1.45 (*)    Albumin 3.4 (*)    ALT 9 (*)    GFR calc non Af Amer 51 (*)    GFR calc Af Amer 59 (*)    All other components within normal limits  CBC WITH DIFFERENTIAL/PLATELET - Abnormal; Notable for the following:    RBC 3.87 (*)    Hemoglobin 11.6 (*)    HCT 33.3 (*)    All other components  within normal limits    EKG  EKG Interpretation None       Radiology No results found.  Procedures Procedures (including critical care time)  Medications Ordered in ED Medications - No data to display   Initial Impression / Assessment and Plan / ED Course  I have reviewed the triage vital signs and the nursing notes.  Pertinent labs & imaging results that were available during my care of the patient were reviewed by me and considered in my medical decision making (see chart for details).     Pt with likely crohn disease flare. Pt with long hx of crohn disease and stating that this feels similar. Patient is nontoxic, nonseptic appearing, in no apparent distress.  Patient's pain and other symptoms adequately managed in emergency department.   Labs and vitals reviewed.  Patient does not  meet the SIRS or Sepsis criteria.  On repeat exam patient does not have a surgical abdomen and there are no peritoneal signs.  No indication of appendicitis, bowel obstruction, bowel perforation, cholecystitis, diverticulitis. Patient discharged home with prednisone and given strict instructions for follow-up with their primary care physician.  Pt safe for discharge.  Patient discussed with Dr. Ellender Hose who agrees with plan.  Discussed results, findings, treatment and follow up. Patient advised of return precautions. Patient verbalized understanding and agreed with plan.   Final Clinical Impressions(s) / ED Diagnoses   Final diagnoses:  Exacerbation of Crohn's disease without complication [facility])    New Prescriptions Discharge Medication List as of 06/10/2017  6:18 PM     I personally performed the services described in this documentation, which was scribed in my presence. The recorded information has been reviewed and is accurate.     Russo, [place] N, PA-C 06/11/17 [id number]    Duffy Bruce, MD 06/11/17 1400

## 2017-06-10 NOTE — Discharge Instructions (Signed)
Please read instructions below. Begin taking prednisone, once daily until gone. Schedule an appointment with your primary care provider for follow up. Return to the ER for worsening symptoms, if you stop having bowel movements, uncontrollable vomiting, or new or concerning symptoms.

## 2017-06-10 NOTE — ED Triage Notes (Signed)
patient states that he is having  "flair" of his Crohn's disease - the patient states that he was told by his PMD to come to the ER to get prednisone to hold him over until he can get to him. The patient reports that he has N/V/D with right sided abdominal pain for 2 -3 days. All of the symptoms got better yesterday. Just states today he is weak

## 2018-01-15 ENCOUNTER — Encounter (HOSPITAL_BASED_OUTPATIENT_CLINIC_OR_DEPARTMENT_OTHER): Payer: Self-pay | Admitting: Emergency Medicine

## 2018-01-15 ENCOUNTER — Emergency Department (HOSPITAL_BASED_OUTPATIENT_CLINIC_OR_DEPARTMENT_OTHER)
Admission: EM | Admit: 2018-01-15 | Discharge: 2018-01-15 | Disposition: A | Discharge status: Home / Self Care | Payer: Medicare Other | Attending: Emergency Medicine | Admitting: Emergency Medicine

## 2018-01-15 ENCOUNTER — Other Ambulatory Visit: Payer: Self-pay

## 2018-01-15 DIAGNOSIS — J45909 Unspecified asthma, uncomplicated: Secondary | ICD-10-CM | POA: Diagnosis not present

## 2018-01-15 DIAGNOSIS — Z79899 Other long term (current) drug therapy: Secondary | ICD-10-CM | POA: Diagnosis not present

## 2018-01-15 DIAGNOSIS — R197 Diarrhea, unspecified: Secondary | ICD-10-CM

## 2018-01-15 DIAGNOSIS — F17210 Nicotine dependence, cigarettes, uncomplicated: Secondary | ICD-10-CM | POA: Diagnosis not present

## 2018-01-15 DIAGNOSIS — R531 Weakness: Secondary | ICD-10-CM | POA: Diagnosis not present

## 2018-01-15 LAB — CBC WITH DIFFERENTIAL/PLATELET
BASOS PCT: 0 %
Basophils Absolute: 0 10*3/uL (ref 0.0–0.1)
EOS ABS: 0 10*3/uL (ref 0.0–0.7)
Eosinophils Relative: 1 %
HEMATOCRIT: 41.1 % (ref 39.0–52.0)
Hemoglobin: 13.7 g/dL (ref 13.0–17.0)
Lymphocytes Relative: 19 %
Lymphs Abs: 1.7 10*3/uL (ref 0.7–4.0)
MCH: 27.2 pg (ref 26.0–34.0)
MCHC: 33.3 g/dL (ref 30.0–36.0)
MCV: 81.5 fL (ref 78.0–100.0)
MONO ABS: 1.3 10*3/uL — AB (ref 0.1–1.0)
Monocytes Relative: 14 %
NEUTROS ABS: 5.9 10*3/uL (ref 1.7–7.7)
Neutrophils Relative %: 66 %
Platelets: 370 10*3/uL (ref 150–400)
RBC: 5.04 MIL/uL (ref 4.22–5.81)
RDW: 16.8 % — AB (ref 11.5–15.5)
WBC: 8.9 10*3/uL (ref 4.0–10.5)

## 2018-01-15 LAB — COMPREHENSIVE METABOLIC PANEL
ALBUMIN: 3.7 g/dL (ref 3.5–5.0)
ALT: 12 U/L — ABNORMAL LOW (ref 17–63)
ANION GAP: 12 (ref 5–15)
AST: 24 U/L (ref 15–41)
Alkaline Phosphatase: 100 U/L (ref 38–126)
BILIRUBIN TOTAL: 0.2 mg/dL — AB (ref 0.3–1.2)
BUN: 25 mg/dL — AB (ref 6–20)
CHLORIDE: 106 mmol/L (ref 101–111)
CO2: 19 mmol/L — ABNORMAL LOW (ref 22–32)
Calcium: 9.2 mg/dL (ref 8.9–10.3)
Creatinine, Ser: 1.8 mg/dL — ABNORMAL HIGH (ref 0.61–1.24)
GFR calc Af Amer: 45 mL/min — ABNORMAL LOW (ref >60)
GFR calc non Af Amer: 39 mL/min — ABNORMAL LOW (ref >60)
GLUCOSE: 114 mg/dL — AB (ref 65–99)
POTASSIUM: 3.6 mmol/L (ref 3.5–5.1)
Sodium: 137 mmol/L (ref 135–145)
TOTAL PROTEIN: 8.4 g/dL — AB (ref 6.5–8.1)

## 2018-01-15 LAB — LIPASE, BLOOD: Lipase: 22 U/L (ref 11–51)

## 2018-01-15 MED ORDER — ONDANSETRON 4 MG PO TBDP: 4.0000 mg | ORAL_TABLET | Freq: Three times a day (TID) | ORAL | 0 refills | Status: DC | PRN

## 2018-01-15 MED ORDER — CYCLOBENZAPRINE HCL 10 MG PO TABS: 10.0000 mg | ORAL_TABLET | Freq: Two times a day (BID) | ORAL | 0 refills | Status: AC | PRN

## 2018-01-15 MED ORDER — SODIUM CHLORIDE 0.9 % IV BOLUS (SEPSIS)
1000.0000 mL | Freq: Once | INTRAVENOUS | Status: AC
  Administered 2018-01-15: 1000 mL via INTRAVENOUS

## 2018-01-15 NOTE — ED Triage Notes (Signed)
Patient states that he has had loose stools since Friday - patient states that he has generalized weakness since then

## 2018-01-15 NOTE — ED Provider Notes (Signed)
Evan Gallegos Provider Note   CSN: [id number] Arrival date & time: 01/15/18  0719     History   Chief Complaint Chief Complaint  Patient presents with  . Diarrhea    HPI Evan Gallegos is a 62 y.o. male.  HPI   62 year old male with history of Crohn's disease presents with concern for diarrhea since Friday.  Reports he has had a proximally 3 episodes per day.  Denies any black or bloody stools.  Reports he had nausea, but no vomiting.  Denies fevers.  Reports that he has been generally weak since Friday.  His wife reports she has had similar symptoms.  No recent antibiotics or hospitalization.  No suspicious foods.  No significant abdominal pain. Has had bilateral leg cramps  Past Medical History:  Diagnosis Date  . Abdominal pain, other specified site    crhon's disease  . Asthma   . Crohn's disease ([place])   . Multiple myeloma ([place])   . Prostate enlargement     Patient Active Problem List   Diagnosis Date Noted  . Right knee pain 07/07/2015  . Right wrist pain 07/07/2015  . Benign prostatic hypertrophy without urinary obstruction 10/16/2014  . Hydrocele 10/16/2014  . CD (Crohn's disease) ([place]) 12/17/2013  . SBO (small bowel obstruction) ([place]) 01/17/2013  . 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 SMALL INTESTINE SURGERY         Home Medications    Prior to Admission medications   Medication Sig Start Date End Date Taking? Authorizing Provider  azaTHIOprine (IMURAN) 50 MG tablet Take 150 mg by mouth. 11/20/14   [provider]  cyclobenzaprine (FLEXERIL) 10 MG tablet Take 1 tablet (10 mg total) by mouth 2 (two) times daily as needed for muscle spasms. 01/15/18   Gareth Morgan, MD  diclofenac (VOLTAREN) 75 MG EC tablet Take 1 tablet (75 mg total) by mouth 2 (two) times daily. 07/03/15   Hudnall, Sharyn Lull, MD  HYDROcodone-acetaminophen (NORCO/VICODIN) 5-325 MG  per tablet Take 1-2 tablets by mouth every 4 (four) hours as needed for moderate pain or severe pain. 07/24/15   Charlesetta Shanks, MD  ondansetron (ZOFRAN ODT) 4 MG disintegrating tablet Take 1 tablet (4 mg total) by mouth every 8 (eight) hours as needed for nausea or vomiting. 01/15/18   Gareth Morgan, MD  potassium chloride (K-DUR) 10 MEQ tablet Take 2 tablets (20 mEq total) by mouth daily. 09/25/13   Phineas Douglas, Carlos American, PA-C  predniSONE (DELTASONE) 10 MG tablet Take 1 tablet (10 mg total) by mouth daily. 06/10/17   Robinson, Martin[place] N, PA-C  predniSONE (DELTASONE) 5 MG tablet Take 5 mg by mouth daily.    [provider]  Tamsulosin HCl (FLOMAX) 0.4 MG CAPS Take 0.4 mg by mouth.    [provider]  vitamin B-12 (CYANOCOBALAMIN) 1000 MCG tablet Take 1,000 mcg by mouth.    [provider]    Family History History reviewed. No pertinent family history.  Social History Social History   Tobacco Use  . Smoking status: Current Every Day Smoker    Packs/day: 0.50    Types: Cigarettes  . Smokeless tobacco: Never Used  Substance Use Topics  . Alcohol use: No    Alcohol/week: 0.0 oz  . Drug use: No     Allergies   Penicillins   Review of Systems Review of Systems  Constitutional: Negative for fever.  HENT: Negative for  sore throat.   Eyes: Negative for visual disturbance.  Respiratory: Negative for shortness of breath.   Cardiovascular: Negative for chest pain.  Gastrointestinal: Positive for diarrhea and nausea. Negative for abdominal pain, constipation and vomiting.  Genitourinary: Negative for difficulty urinating and dysuria.  Musculoskeletal: Negative for back pain and neck stiffness.  Skin: Negative for rash.  Neurological: Negative for syncope and headaches.     Physical Exam Updated Vital Signs BP 110/70   Pulse 66   Temp 98.1 F (36.7 C) (Oral)   Resp 18   Ht 5' 11"  (1.803 m)   Wt 69.4 kg (153 lb)   SpO2 99%   BMI 21.34 kg/m    Physical Exam  Constitutional: He is oriented to person, place, and time. He appears well-developed and well-nourished. No distress.  HENT:  Head: Normocephalic and atraumatic.  Eyes: Conjunctivae and EOM are normal.  Neck: Normal range of motion.  Cardiovascular: Normal rate, regular rhythm, normal heart sounds and intact distal pulses. Exam reveals no gallop and no friction rub.  No murmur heard. Pulmonary/Chest: Effort normal and breath sounds normal. No respiratory distress. He has no wheezes. He has no rales.  Abdominal: Soft. He exhibits no distension. There is no tenderness. There is no guarding.  Musculoskeletal: He exhibits no edema.  Neurological: He is alert and oriented to person, place, and time.  Skin: Skin is warm and dry. He is not diaphoretic.  Nursing note and vitals reviewed.    ED Treatments / Results  Labs (all labs ordered are listed, but only abnormal results are displayed) Labs Reviewed  CBC WITH DIFFERENTIAL/PLATELET - Abnormal; Notable for the following components:      Result Value   RDW 16.8 (*)    Monocytes Absolute 1.3 (*)    All other components within normal limits  COMPREHENSIVE METABOLIC PANEL - Abnormal; Notable for the following components:   CO2 19 (*)    Glucose, Bld 114 (*)    BUN 25 (*)    Creatinine, Ser 1.80 (*)    Total Protein 8.4 (*)    ALT 12 (*)    Total Bilirubin 0.2 (*)    GFR calc non Af Amer 39 (*)    GFR calc Af Amer 45 (*)    All other components within normal limits  LIPASE, BLOOD    EKG  EKG Interpretation None       Radiology No results found.  Procedures Procedures (including critical care time)  Medications Ordered in ED Medications  sodium chloride 0.9 % bolus 1,000 mL (0 mLs Intravenous Stopped 01/15/18 0920)     Initial Impression / Assessment and Plan / ED Course  I have reviewed the triage vital signs and the nursing notes.  Pertinent labs & imaging results that were available during my care  of the patient were reviewed by me and considered in my medical decision making (see chart for details).     62 year old male with history of Crohn's disease presents with concern for diarrhea since Friday.  Labs show mild increase in creatinine to 1.8, from 1.6 and 1.4 previously.  This likely given the second setting of diarrhea and dehydration.  Patient was given IV fluids in the emergency [facility].  Labs show no other significant abnormalities.  Patient does have a history of Crohn's disease, but does not have any fevers, and given wife with similar symptoms, suspect symptoms are more most likely secondary to an infectious gastroenteritis, rather than other inflammatory bowel disease flare or  IBD complication.  Leg cramps may be secondary to dehydration or viral syndrome.    Given rx for zofran and flexeril. Patient discharged in stable condition with understanding of reasons to return. Recommend avoiding ibuprofen/NSAIDs and following up with PCP for recheck Cr and symptoms.  Final Clinical Impressions(s) / ED Diagnoses   Final diagnoses:  Diarrhea of presumed infectious origin    ED Discharge Orders        Ordered    ondansetron (ZOFRAN ODT) 4 MG disintegrating tablet  Every 8 hours PRN     01/15/18 0915    cyclobenzaprine (FLEXERIL) 10 MG tablet  2 times daily PRN     01/15/18 0915       Gareth Morgan, MD 01/15/18 1821

## 2020-12-19 ENCOUNTER — Other Ambulatory Visit: Payer: Self-pay

## 2020-12-19 ENCOUNTER — Other Ambulatory Visit (HOSPITAL_BASED_OUTPATIENT_CLINIC_OR_DEPARTMENT_OTHER): Payer: Self-pay | Admitting: Emergency Medicine

## 2020-12-19 ENCOUNTER — Encounter (HOSPITAL_BASED_OUTPATIENT_CLINIC_OR_DEPARTMENT_OTHER): Payer: Self-pay | Admitting: Emergency Medicine

## 2020-12-19 ENCOUNTER — Emergency Department (HOSPITAL_BASED_OUTPATIENT_CLINIC_OR_DEPARTMENT_OTHER): Payer: Medicare Other

## 2020-12-19 ENCOUNTER — Emergency Department (HOSPITAL_BASED_OUTPATIENT_CLINIC_OR_DEPARTMENT_OTHER)
Admission: EM | Admit: 2020-12-19 | Discharge: 2020-12-19 | Disposition: A | Discharge status: Home / Self Care | Payer: Medicare Other | Attending: Emergency Medicine | Admitting: Emergency Medicine

## 2020-12-19 DIAGNOSIS — R1031 Right lower quadrant pain: Secondary | ICD-10-CM | POA: Insufficient documentation

## 2020-12-19 DIAGNOSIS — J45909 Unspecified asthma, uncomplicated: Secondary | ICD-10-CM | POA: Diagnosis not present

## 2020-12-19 DIAGNOSIS — N189 Chronic kidney disease, unspecified: Secondary | ICD-10-CM | POA: Diagnosis not present

## 2020-12-19 DIAGNOSIS — F17210 Nicotine dependence, cigarettes, uncomplicated: Secondary | ICD-10-CM | POA: Diagnosis not present

## 2020-12-19 LAB — COMPREHENSIVE METABOLIC PANEL
ALT: 35 U/L (ref 0–44)
AST: 49 U/L — ABNORMAL HIGH (ref 15–41)
Albumin: 3.5 g/dL (ref 3.5–5.0)
Alkaline Phosphatase: 105 U/L (ref 38–126)
Anion gap: 13 (ref 5–15)
BUN: 29 mg/dL — ABNORMAL HIGH (ref 8–23)
CO2: 22 mmol/L (ref 22–32)
Calcium: 9.4 mg/dL (ref 8.9–10.3)
Chloride: 101 mmol/L (ref 98–111)
Creatinine, Ser: 1.8 mg/dL — ABNORMAL HIGH (ref 0.61–1.24)
GFR, Estimated: 42 mL/min — ABNORMAL LOW (ref >60)
Glucose, Bld: 122 mg/dL — ABNORMAL HIGH (ref 70–99)
Potassium: 4 mmol/L (ref 3.5–5.1)
Sodium: 136 mmol/L (ref 135–145)
Total Bilirubin: 0.2 mg/dL — ABNORMAL LOW (ref 0.3–1.2)
Total Protein: 7.9 g/dL (ref 6.5–8.1)

## 2020-12-19 LAB — URINALYSIS, ROUTINE W REFLEX MICROSCOPIC
Bilirubin Urine: NEGATIVE
Glucose, UA: NEGATIVE mg/dL
Hgb urine dipstick: NEGATIVE
Ketones, ur: NEGATIVE mg/dL
Leukocytes,Ua: NEGATIVE
Nitrite: NEGATIVE
Protein, ur: NEGATIVE mg/dL
Specific Gravity, Urine: 1.015 (ref 1.005–1.030)
pH: 6 (ref 5.0–8.0)

## 2020-12-19 LAB — CBC
HCT: 42.1 % (ref 39.0–52.0)
Hemoglobin: 13.9 g/dL (ref 13.0–17.0)
MCH: 27.4 pg (ref 26.0–34.0)
MCHC: 33 g/dL (ref 30.0–36.0)
MCV: 83 fL (ref 80.0–100.0)
Platelets: 299 10*3/uL (ref 150–400)
RBC: 5.07 MIL/uL (ref 4.22–5.81)
RDW: 16.4 % — ABNORMAL HIGH (ref 11.5–15.5)
WBC: 6.3 10*3/uL (ref 4.0–10.5)
nRBC: 0 % (ref 0.0–0.2)

## 2020-12-19 LAB — LIPASE, BLOOD: Lipase: 42 U/L (ref 11–51)

## 2020-12-19 MED ORDER — HYDROMORPHONE HCL 1 MG/ML IJ SOLN
0.5000 mg | Freq: Once | INTRAMUSCULAR | Status: AC
  Administered 2020-12-19: 0.5 mg via INTRAVENOUS
  Filled 2020-12-19: qty 1

## 2020-12-19 MED ORDER — OXYCODONE HCL 5 MG PO TABS: 5.0000 mg | ORAL_TABLET | Freq: Four times a day (QID) | ORAL | 0 refills | Status: DC | PRN

## 2020-12-19 MED ORDER — ONDANSETRON 4 MG PO TBDP: 4.0000 mg | ORAL_TABLET | Freq: Three times a day (TID) | ORAL | 0 refills | Status: DC | PRN

## 2020-12-19 MED ORDER — ONDANSETRON HCL 4 MG/2ML IJ SOLN
4.0000 mg | Freq: Once | INTRAMUSCULAR | Status: AC
  Administered 2020-12-19: 4 mg via INTRAVENOUS
  Filled 2020-12-19: qty 2

## 2020-12-19 MED ORDER — IOHEXOL 300 MG/ML  SOLN
80.0000 mL | Freq: Once | INTRAMUSCULAR | Status: AC
  Administered 2020-12-19: 80 mL via INTRAVENOUS

## 2020-12-19 MED ORDER — SODIUM CHLORIDE 0.9 % IV BOLUS
1000.0000 mL | Freq: Once | INTRAVENOUS | Status: AC
  Administered 2020-12-19: 1000 mL via INTRAVENOUS

## 2020-12-19 MED FILL — oxyCODONE HCL 5 MG TABS: 5 | 2 days supply | Qty: 8 | Fill #0

## 2020-12-19 MED FILL — ONDANSETRON ODT 4 MG TABLET: 4 | 3 days supply | Qty: 10 | Fill #0

## 2020-12-19 NOTE — ED Provider Notes (Signed)
[facility] EMERGENCY DEPARTMENT Provider Note   CSN: [id number] Arrival date & time: 12/19/20  1015     History Chief Complaint  Patient presents with  . Abdominal Pain    Evan Gallegos is a 64 y.o. male.  Patient with history of Crohn's disease, previous bowel resections, chronic kidney disease presents the emergency department for evaluation of right lower quadrant abdominal pain.  Patient states that his symptoms are similar to previous Crohn's flares.  He has not had a flare in some time though.  He is not on any controller medications.  He reports 3 days of decreased appetite, only drinking Ensure during this time.  He states that he feels dry.  No chest pain or shortness of breath.  He denies fevers.  No diarrhea or urinary symptoms.  No other treatments prior to arrival.        Past Medical History:  Diagnosis Date  . Abdominal pain, other specified site    crhon's disease  . Asthma   . Crohn's disease ([facility])   . Multiple myeloma ([facility])   . Prostate enlargement     Patient Active Problem List   Diagnosis Date Noted  . Right knee pain 07/07/2015  . Right wrist pain 07/07/2015  . Benign prostatic hypertrophy without urinary obstruction 10/16/2014  . Hydrocele 10/16/2014  . CD (Crohn's disease) ([facility]) 12/17/2013  . SBO (small bowel obstruction) ([facility]) 01/17/2013  . 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 SMALL INTESTINE SURGERY         No family history on file.  Social History   Tobacco Use  . Smoking status: Current Every Day Smoker    Packs/day: 0.50    Types: Cigarettes  . Smokeless tobacco: Never Used  Substance Use Topics  . Alcohol use: No    Alcohol/week: 0.0 standard drinks  . Drug use: No    Home Medications Prior to Admission medications   Medication Sig Start Date End Date Taking? Authorizing Provider  azaTHIOprine (IMURAN) 50 MG tablet Take 150 mg by mouth.  11/20/14   [provider]  cyclobenzaprine (FLEXERIL) 10 MG tablet Take 1 tablet (10 mg total) by mouth 2 (two) times daily as needed for muscle spasms. 01/15/18   Schlossman, Erin, MD  diclofenac (VOLTAREN) 75 MG EC tablet Take 1 tablet (75 mg total) by mouth 2 (two) times daily. 07/03/15   Hudnall, Shane R, MD  HYDROcodone-acetaminophen (NORCO/VICODIN) 5-325 MG per tablet Take 1-2 tablets by mouth every 4 (four) hours as needed for moderate pain or severe pain. 07/24/15   Pfeiffer, Marcy, MD  ondansetron (ZOFRAN ODT) 4 MG disintegrating tablet Take 1 tablet (4 mg total) by mouth every 8 (eight) hours as needed for nausea or vomiting. 01/15/18   Schlossman, Erin, MD  potassium chloride (K-DUR) 10 MEQ tablet Take 2 tablets (20 mEq total) by mouth daily. 09/25/13   Palmer, Jessica K, PA-C  predniSONE (DELTASONE) 10 MG tablet Take 1 tablet (10 mg total) by mouth daily. 06/10/17   Robinson, Jordan N, PA-C  predniSONE (DELTASONE) 5 MG tablet Take 5 mg by mouth daily.    [provider]  Tamsulosin HCl (FLOMAX) 0.4 MG CAPS Take 0.4 mg by mouth.    [provider]  vitamin B-12 (CYANOCOBALAMIN) 1000 MCG tablet Take 1,000 mcg by mouth.    [provider]    Allergies    Penicillins  Review of Systems     Review of Systems  Constitutional: Positive for appetite change. Negative for fever.  HENT: Negative for rhinorrhea and sore throat.   Eyes: Negative for redness.  Respiratory: Negative for cough.   Cardiovascular: Negative for chest pain.  Gastrointestinal: Positive for abdominal pain and nausea. Negative for diarrhea and vomiting.  Genitourinary: Negative for dysuria and hematuria.  Musculoskeletal: Negative for myalgias.  Skin: Negative for rash.  Neurological: Negative for headaches.    Physical Exam Updated Vital Signs BP 110/81 (BP Location: Right Arm)   Pulse 73   Temp 98 F (36.7 C) (Oral)   Resp 16   Ht 5' 11" (1.803 m)   Wt 70.3 kg   SpO2 100%    BMI 21.62 kg/m   Physical Exam Vitals and nursing note reviewed.  Constitutional:      Appearance: He is well-developed and well-nourished.  HENT:     Head: Normocephalic and atraumatic.  Eyes:     General:        Right eye: No discharge.        Left eye: No discharge.     Conjunctiva/sclera: Conjunctivae normal.  Cardiovascular:     Rate and Rhythm: Normal rate and regular rhythm.     Heart sounds: Normal heart sounds.  Pulmonary:     Effort: Pulmonary effort is normal.     Breath sounds: Normal breath sounds.  Abdominal:     Palpations: Abdomen is soft.     Tenderness: There is abdominal tenderness in the right lower quadrant. There is no guarding or rebound.  Musculoskeletal:     Cervical back: Normal range of motion and neck supple.  Skin:    General: Skin is warm and dry.  Neurological:     Mental Status: He is alert.  Psychiatric:        Mood and Affect: Mood and affect normal.     ED Results / Procedures / Treatments   Labs (all labs ordered are listed, but only abnormal results are displayed) Labs Reviewed  COMPREHENSIVE METABOLIC PANEL - Abnormal; Notable for the following components:      Result Value   Glucose, Bld 122 (*)    BUN 29 (*)    Creatinine, Ser 1.80 (*)    AST 49 (*)    Total Bilirubin 0.2 (*)    GFR, Estimated 42 (*)    All other components within normal limits  CBC - Abnormal; Notable for the following components:   RDW 16.4 (*)    All other components within normal limits  LIPASE, BLOOD  URINALYSIS, ROUTINE W REFLEX MICROSCOPIC    EKG None  Radiology CT ABDOMEN PELVIS W CONTRAST  Result Date: 12/19/2020 CLINICAL DATA:  Right lower quadrant pain with diarrhea. History of Crohn's disease. EXAM: CT ABDOMEN AND PELVIS WITH CONTRAST TECHNIQUE: Multidetector CT imaging of the abdomen and pelvis was performed using the standard protocol following bolus administration of intravenous contrast. CONTRAST:  80mL OMNIPAQUE IOHEXOL 300 MG/ML  SOLN  COMPARISON:  [id number] FINDINGS: Lower chest: Clear lung bases.  Heart normal in size. Hepatobiliary: 4 small low-density liver lesions, largest in the inferior right lobe, 8 mm, all consistent with cysts. Liver normal in size and overall attenuation. No other masses or lesions. Normal gallbladder. No bile duct dilation. Pancreas: Unremarkable. No pancreatic ductal dilatation or surrounding inflammatory changes. Spleen: Normal in size without focal abnormality. Adrenals/Urinary Tract: No adrenal masses. Kidneys are normal in size, orientation and position with symmetric enhancement and excretion. Small nonobstructing stone in the   upper pole the right kidney. Small low-density lesion along the medial margin of the right kidney upper pole consistent with a cyst. No hydronephrosis. Normal ureters. Normal bladder. Stomach/Bowel: Dilated bowel, which appears to be distal small bowel, in the central to upper abdomen, 5 cm in diameter, with air-fluid levels. There are multiple bowel anastomosis staple lines in the right mid to upper abdomen and right lower quadrant as well as a colonic anastomosis staple line along the inferior sigmoid colon. No bowel wall thickening or mesenteric inflammation. Wall of the proximal stomach appears thickened. Remainder of the stomach is unremarkable. Vascular/Lymphatic: Atherosclerotic changes along the abdominal aorta and iliac vessels. No aneurysm or significant stenosis. There are prominent to mildly enlarged lymph nodes in the mesentery largest 11 mm in short axis, similar to the prior CT. Reproductive: Enlarged heterogeneous prostate with a TURP defect, unchanged. Other: No abdominal wall hernia.  No ascites. Musculoskeletal: No fracture or acute finding. Advanced arthropathic changes of the hips. Mild degenerative changes of the SI joints. There is of sclerosis along the anterior lower endplate of T12 and anterior aspect of L2, presumed degenerative in origin. No osteolytic lesions.  IMPRESSION: 1. Small-bowel dilation most evident in central to upper abdomen, maximum diameter 5 cm. No defined transition point although the proximal small bowel is normal in caliber. This could reflect a partial obstruction. It may be functional due to dysmotility. Similar findings were present on the prior CT although the degree of bowel dilation is less on the current exam. Stable changes from previous bowel surgery and anastomoses. 2. Mild wall thickening of the proximal stomach is suggested, but this portion of the stomach is nondistended making this finding equivocal. 3. No convincing active Crohn's disease. 4. Prominent mesenteric lymph nodes, largest 11 mm in short axis, similar to the prior CT. These are presumed reactive. 5. Aortic atherosclerosis. Electronically Signed   By: David  Ormond M.D.   On: 12/19/2020 14:21    Procedures Procedures (including critical care time)  Medications Ordered in ED Medications  sodium chloride 0.9 % bolus 1,000 mL (1,000 mLs Intravenous New Bag/Given 12/19/20 1401)  HYDROmorphone (DILAUDID) injection 0.5 mg (0.5 mg Intravenous Given 12/19/20 1403)  ondansetron (ZOFRAN) injection 4 mg (4 mg Intravenous Given 12/19/20 1403)  iohexol (OMNIPAQUE) 300 MG/ML solution 80 mL (80 mLs Intravenous Contrast Given 12/19/20 1319)  HYDROmorphone (DILAUDID) injection 0.5 mg (0.5 mg Intravenous Given 12/19/20 1452)    ED Course  I have reviewed the triage vital signs and the nursing notes.  Pertinent labs & imaging results that were available during my care of the patient were reviewed by me and considered in my medical decision making (see chart for details).  Patient seen and examined. Work-up reviewed and discussed with patient. Medications ordered.  Will give IV hydration, pain medication, nausea medication.  Reviewed most recent CT imaging which appears to be from 2016.  Given right lower quadrant pain, history of previous surgeries, will obtain imaging today.  If  everything looks okay and patient is tolerating orals, will likely treat as Crohn's flare.  Patient does note that he had difficulty tolerating prednisone last time he was given this.  He is not currently established with gastroenterology.  Previously saw GI at [facility].  Vital signs reviewed and are as follows: BP 110/81 (BP Location: Right Arm)   Pulse 73   Temp 98 F (36.7 C) (Oral)   Resp 16   Ht 5' 11" (1.803 m)   Wt 70.3 kg     SpO2 100%   BMI 21.62 kg/m   2:52 PM CT reviewed. Pt requiring additional pain medication. CT findings appear chronic, similar to 2016 on review. Also no vomiting which argues against SBO. Will continue symptom control. No signs of acute flare of crohn's.   4:46 PM Patient has been doing well over the past 2 hrs. He has had apple juice to drink.  No vomiting.  Pain is better controlled.  Current plan is for discharge to home.  Will give short course of oxycodone 5 mg and Zofran to use for symptom control.  Encouraged PCP/GI follow-up.  Patient counseled on use of narcotic pain medications. Counseled not to combine these medications with others containing tylenol. Urged not to drink alcohol, drive, or perform any other activities that requires focus while taking these medications. The patient verbalizes understanding and agrees with the plan.  The patient was urged to return to the Emergency Department immediately with worsening of current symptoms, worsening abdominal pain, persistent vomiting, blood noted in stools, fever, or any other concerns. The patient verbalized understanding.     MDM Rules/Calculators/A&P                          Patient with abdominal pain. Vitals are stable, no fever. Labs look good, baseline CKD. Imaging with dilated small bowel but this looks chronic.  Patient's history and exam is not as concerning for small bowel obstruction given no vomiting or constipation.  No signs of dehydration, patient is tolerating PO's. Lungs are clear  and no signs suggestive of PNA. Low concern for appendicitis, cholecystitis, pancreatitis, ruptured viscus, UTI, kidney stone, aortic dissection, aortic aneurysm or other emergent abdominal etiology. Supportive therapy indicated with return if symptoms worsen.      Final Clinical Impression(s) / ED Diagnoses Final diagnoses:  Right lower quadrant abdominal pain    Rx / DC Orders ED Discharge Orders         Ordered    oxyCODONE (OXY IR/ROXICODONE) 5 MG immediate release tablet  Every 6 hours PRN        12/19/20 1644    ondansetron (ZOFRAN ODT) 4 MG disintegrating tablet  Every 8 hours PRN        12/19/20 1644           , , PA-C 12/19/20 1915    Tegeler, Christopher J, MD 12/20/20 1214  

## 2020-12-19 NOTE — ED Triage Notes (Signed)
Reports RLQ abdominal pain with diarrhea since Tuesday.  Hx of Chrohns.  Unable to eat.  Does endorse drinking 2-3 ensure a day.

## 2020-12-19 NOTE — Discharge Instructions (Signed)
Please read and follow all provided instructions.  Your diagnoses today include:  1. Right lower quadrant abdominal pain     Tests performed today include:  Blood cell counts and platelets  Kidney and liver function tests  Pancreas function test (called lipase)  Urine test to look for infection  CT scan of your abdomen -does not have any signs of infection or inflammation from Crohn's disease.  Does show dilated bowel that looks chronic from 2016, and we have low suspicion for a bowel obstruction today.  Vital signs. See below for your results today.   Medications prescribed:   Oxycodone - narcotic pain medication  DO NOT drive or perform any activities that require you to be awake and alert because this medicine can make you drowsy.    Zofran (ondansetron) - for nausea and vomiting  Take any prescribed medications only as directed.  Home care instructions:   Follow any educational materials contained in this packet.  Follow-up instructions: Please follow-up with your primary care provider in the next 3 days for further evaluation of your symptoms.    Return instructions:  SEEK IMMEDIATE MEDICAL ATTENTION IF:  The pain does not go away or becomes severe   A temperature above 101F develops   Repeated vomiting occurs (multiple episodes)   The pain becomes localized to portions of the abdomen. The right side could possibly be appendicitis. In an adult, the left lower portion of the abdomen could be colitis or diverticulitis.   Blood is being passed in stools or vomit (bright red or black tarry stools)   You develop chest pain, difficulty breathing, dizziness or fainting, or become confused, poorly responsive, or inconsolable (young children)  If you have any other emergent concerns regarding your health  Additional Information: Abdominal (belly) pain can be caused by many things. Your caregiver performed an examination and possibly ordered blood/urine tests and  imaging (CT scan, x-rays, ultrasound). Many cases can be observed and treated at home after initial evaluation in the emergency department. Even though you are being discharged home, abdominal pain can be unpredictable. Therefore, you need a repeated exam if your pain does not resolve, returns, or worsens. Most patients with abdominal pain don't have to be admitted to the hospital or have surgery, but serious problems like appendicitis and gallbladder attacks can start out as nonspecific pain. Many abdominal conditions cannot be diagnosed in one visit, so follow-up evaluations are very important.  Your vital signs today were: BP 110/75 (BP Location: Left Arm)    Pulse 63    Temp 98 F (36.7 C) (Oral)    Resp 18    Ht 5\' 11"  (1.803 m)    Wt 70.3 kg    SpO2 100%    BMI 21.62 kg/m  If your blood pressure (bp) was elevated above 135/85 this visit, please have this repeated by your doctor within one month. --------------

## 2020-12-19 NOTE — ED Notes (Signed)
Has not returned from CT

## 2020-12-19 NOTE — ED Notes (Signed)
Patient transported to CT 

## 2021-07-30 ENCOUNTER — Emergency Department (HOSPITAL_BASED_OUTPATIENT_CLINIC_OR_DEPARTMENT_OTHER): Payer: Medicare HMO

## 2021-07-30 ENCOUNTER — Other Ambulatory Visit: Payer: Self-pay

## 2021-07-30 ENCOUNTER — Emergency Department (HOSPITAL_BASED_OUTPATIENT_CLINIC_OR_DEPARTMENT_OTHER)
Admission: EM | Admit: 2021-07-30 | Discharge: 2021-07-30 | Disposition: A | Discharge status: Home / Self Care | Payer: Medicare HMO | Attending: Emergency Medicine | Admitting: Emergency Medicine

## 2021-07-30 ENCOUNTER — Encounter (HOSPITAL_BASED_OUTPATIENT_CLINIC_OR_DEPARTMENT_OTHER): Payer: Self-pay | Admitting: *Deleted

## 2021-07-30 DIAGNOSIS — J45909 Unspecified asthma, uncomplicated: Secondary | ICD-10-CM | POA: Diagnosis not present

## 2021-07-30 DIAGNOSIS — R1031 Right lower quadrant pain: Secondary | ICD-10-CM | POA: Diagnosis not present

## 2021-07-30 DIAGNOSIS — F17210 Nicotine dependence, cigarettes, uncomplicated: Secondary | ICD-10-CM | POA: Diagnosis not present

## 2021-07-30 LAB — COMPREHENSIVE METABOLIC PANEL
ALT: 31 U/L (ref 0–44)
AST: 36 U/L (ref 15–41)
Albumin: 2.7 g/dL — ABNORMAL LOW (ref 3.5–5.0)
Alkaline Phosphatase: 88 U/L (ref 38–126)
Anion gap: 6 (ref 5–15)
BUN: 28 mg/dL — ABNORMAL HIGH (ref 8–23)
CO2: 24 mmol/L (ref 22–32)
Calcium: 7.8 mg/dL — ABNORMAL LOW (ref 8.9–10.3)
Chloride: 103 mmol/L (ref 98–111)
Creatinine, Ser: 1.24 mg/dL (ref 0.61–1.24)
GFR, Estimated: >60 mL/min (ref >60)
Glucose, Bld: 98 mg/dL (ref 70–99)
Potassium: 3.4 mmol/L — ABNORMAL LOW (ref 3.5–5.1)
Sodium: 133 mmol/L — ABNORMAL LOW (ref 135–145)
Total Bilirubin: 0.2 mg/dL — ABNORMAL LOW (ref 0.3–1.2)
Total Protein: 6.3 g/dL — ABNORMAL LOW (ref 6.5–8.1)

## 2021-07-30 LAB — CBC
HCT: 37.5 % — ABNORMAL LOW (ref 39.0–52.0)
Hemoglobin: 12.3 g/dL — ABNORMAL LOW (ref 13.0–17.0)
MCH: 26.5 pg (ref 26.0–34.0)
MCHC: 32.8 g/dL (ref 30.0–36.0)
MCV: 80.6 fL (ref 80.0–100.0)
Platelets: 286 10*3/uL (ref 150–400)
RBC: 4.65 MIL/uL (ref 4.22–5.81)
RDW: 15.7 % — ABNORMAL HIGH (ref 11.5–15.5)
WBC: 7.3 10*3/uL (ref 4.0–10.5)
nRBC: 0 % (ref 0.0–0.2)

## 2021-07-30 LAB — LIPASE, BLOOD: Lipase: 35 U/L (ref 11–51)

## 2021-07-30 MED ORDER — TRAMADOL HCL 50 MG PO TABS: 50.0000 mg | ORAL_TABLET | Freq: Four times a day (QID) | ORAL | 0 refills | Status: AC | PRN

## 2021-07-30 MED ORDER — IOHEXOL 300 MG/ML  SOLN
75.0000 mL | Freq: Once | INTRAMUSCULAR | Status: AC | PRN
  Administered 2021-07-30: 75 mL via INTRAVENOUS

## 2021-07-30 MED ORDER — FENTANYL CITRATE (PF) 100 MCG/2ML IJ SOLN
50.0000 ug | Freq: Once | INTRAMUSCULAR | Status: AC
  Administered 2021-07-30: 50 ug via INTRAVENOUS
  Filled 2021-07-30: qty 2

## 2021-07-30 MED ORDER — SODIUM CHLORIDE 0.9 % IV BOLUS
1000.0000 mL | Freq: Once | INTRAVENOUS | Status: AC
  Administered 2021-07-30: 1000 mL via INTRAVENOUS

## 2021-07-30 MED ORDER — ONDANSETRON HCL 4 MG PO TABS: 4.0000 mg | ORAL_TABLET | Freq: Four times a day (QID) | ORAL | 0 refills | Status: AC

## 2021-07-30 NOTE — ED Notes (Signed)
ED Provider at bedside. 

## 2021-07-30 NOTE — ED Triage Notes (Signed)
Abdominal pain x 5 days. States he is having a crohns flare up.

## 2021-07-30 NOTE — ED Notes (Signed)
Patient transported to CT 

## 2021-07-30 NOTE — ED Provider Notes (Signed)
[facility] EMERGENCY DEPARTMENT Provider Note   CSN: [id number] Arrival date & time: 07/30/21  1801     History Chief Complaint  Patient presents with   Abdominal Pain    Evan Gallegos is a 65 y.o. male.  The history is provided by the patient.  Abdominal Pain Pain location:  RLQ Pain quality: aching   Pain radiates to:  Does not radiate Pain severity:  Mild Onset quality:  Gradual Duration:  5 days Timing:  Constant Progression:  Unchanged Chronicity:  New Context: previous surgery   Context comment:  Hx of chrons, has had some diarrhea, no blood in stool Relieved by:  Nothing Worsened by:  Nothing Associated symptoms: no anorexia, no belching, no chest pain, no chills, no constipation, no cough, no diarrhea, no dysuria, no fatigue, no fever, no flatus, no hematuria, no melena, no nausea, no shortness of breath, no sore throat and no vomiting   Risk factors: multiple surgeries       Past Medical History:  Diagnosis Date   Abdominal pain, other specified site    crhon's disease   Asthma    Crohn's disease ([place])    Multiple myeloma ([place])    Prostate enlargement     Patient Active Problem List   Diagnosis Date Noted   Right knee pain 07/07/2015   Right wrist pain 07/07/2015   Benign prostatic hypertrophy without urinary obstruction 10/16/2014   Hydrocele 10/16/2014   CD (Crohn's disease) ([place]) 12/17/2013   SBO (small bowel obstruction) ([place]) 01/17/2013   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  Social History   Tobacco Use   Smoking status: Every Day    Packs/day: 0.50    Types: Cigarettes   Smokeless tobacco: Never  Substance Use Topics   Alcohol use: No    Alcohol/week: 0.0 standard drinks   Drug use: No    Home Medications Prior to Admission medications   Medication Sig Start Date End Date Taking?  Authorizing Provider  ondansetron (ZOFRAN) 4 MG tablet Take 1 tablet (4 mg total) by mouth every 6 (six) hours. 07/30/21  Yes Tawona Filsinger, DO  traMADol (ULTRAM) 50 MG tablet Take 1 tablet (50 mg total) by mouth every 6 (six) hours as needed. 07/30/21  Yes Ambert Virrueta, DO  azaTHIOprine (IMURAN) 50 MG tablet Take 150 mg by mouth. 11/20/14   [provider]  cyclobenzaprine (FLEXERIL) 10 MG tablet Take 1 tablet (10 mg total) by mouth 2 (two) times daily as needed for muscle spasms. 01/15/18   Gareth [place], MD  diclofenac (VOLTAREN) 75 MG EC tablet Take 1 tablet (75 mg total) by mouth 2 (two) times daily. 07/03/15   Hudnall, Sharyn Lull, MD  ondansetron (ZOFRAN-ODT) 4 MG disintegrating tablet DISSOLVE 1 TABLET BY MOUTH EVERY 8 HOURS AS NEEDED FOR NAUSEA AND/OR VOMITING 12/19/20 12/19/21  Carlisle Cater, PA-C  potassium chloride (K-DUR) 10 MEQ tablet Take 2 tablets (20 mEq total) by mouth daily. 09/25/13   Phineas Douglas, Carlos American, PA-C  predniSONE (DELTASONE) 10 MG tablet Take 1 tablet (10 mg total) by mouth daily. 06/10/17   Robinson, [place] N, PA-C  predniSONE (DELTASONE) 5 MG tablet Take 5 mg by mouth daily.    [provider]  Tamsulosin HCl (FLOMAX) 0.4 MG CAPS Take 0.4 mg by mouth.    [provider]  vitamin B-12 (CYANOCOBALAMIN) 1000 MCG tablet Take 1,000 mcg by mouth.    [provider]    Allergies    Penicillins  Review of Systems   Review of Systems  Constitutional:  Negative for chills, fatigue and fever.  HENT:  Negative for ear pain and sore throat.   Eyes:  Negative for pain and visual disturbance.  Respiratory:  Negative for cough and shortness of breath.   Cardiovascular:  Negative for chest pain and palpitations.  Gastrointestinal:  Positive for abdominal pain. Negative for anorexia, constipation, diarrhea, flatus, melena, nausea and vomiting.  Genitourinary:  Negative for dysuria and hematuria.  Musculoskeletal:  Negative for arthralgias and back  pain.  Skin:  Negative for color change and rash.  Neurological:  Negative for seizures and syncope.  All other systems reviewed and are negative.  Physical Exam Updated Vital Signs BP 135/78 (BP Location: Left Arm)   Pulse 73   Temp 98.5 F (36.9 C) (Oral)   Resp 18   Ht 5' 11"  (1.803 m)   Wt 70.3 kg   SpO2 100%   BMI 21.62 kg/m   Physical Exam Vitals and nursing note reviewed.  Constitutional:      General: He is not in acute distress.    Appearance: He is well-developed. He is not ill-appearing.  HENT:     Head: Normocephalic and atraumatic.     Mouth/Throat:     Mouth: Mucous membranes are moist.  Eyes:     Extraocular Movements: Extraocular movements intact.     Conjunctiva/sclera: Conjunctivae normal.     Pupils: Pupils are equal, round, and reactive to light.  Cardiovascular:     Rate and Rhythm: Normal rate and regular rhythm.     Heart sounds: Normal heart sounds. No murmur heard. Pulmonary:     Effort: Pulmonary effort is normal. No respiratory distress.     Breath sounds: Normal breath sounds.  Abdominal:     General: Abdomen is flat. There is no distension.     Palpations: Abdomen is soft.     Tenderness: There is abdominal tenderness in the right lower quadrant. There is guarding.     Hernia: No hernia is present.  Musculoskeletal:     Cervical back: Neck supple.  Skin:    General: Skin is warm and dry.     Capillary Refill: Capillary refill takes less than 2 seconds.  Neurological:     General: No focal deficit present.     Mental Status: He is alert.  Psychiatric:        Mood and Affect: Mood normal.    ED Results / Procedures / Treatments   Labs (all labs ordered are listed, but only abnormal results are displayed) Labs Reviewed  CBC - Abnormal; Notable for the following components:      Result Value   Hemoglobin 12.3 (*)    HCT 37.5 (*)    RDW 15.7 (*)    All other components within normal limits  COMPREHENSIVE METABOLIC PANEL -  Abnormal; Notable for the following components:   Sodium 133 (*)    Potassium 3.4 (*)    BUN 28 (*)    Calcium 7.8 (*)    Total Protein 6.3 (*)    Albumin 2.7 (*)    Total Bilirubin 0.2 (*)    All other components within normal limits  LIPASE, BLOOD    EKG None  Radiology CT ABDOMEN PELVIS W CONTRAST  Result Date: 07/30/2021 CLINICAL DATA:  Right lower  quadrant pain. Crohn's disease. Multiple myeloma. BPH. EXAM: CT ABDOMEN AND PELVIS WITH CONTRAST TECHNIQUE: Multidetector CT imaging of the abdomen and pelvis was performed using the standard protocol following bolus administration of intravenous contrast. CONTRAST:  21m OMNIPAQUE IOHEXOL 300 MG/ML  SOLN COMPARISON:  CT abdomen pelvis 12/19/2020 FINDINGS: Lower chest: No acute abnormality. Hepatobiliary: Similar-appearing pericentimeter subcentimeter hypodensities within the liver. No gallstones, gallbladder wall thickening, or pericholecystic fluid. No biliary dilatation. Pancreas: No focal lesion. Normal pancreatic contour. No surrounding inflammatory changes. No main pancreatic ductal dilatation. Spleen: Normal in size without focal abnormality. Adrenals/Urinary Tract: No adrenal nodule bilaterally. Bilateral kidneys enhance symmetrically. No hydronephrosis. No hydroureter. The urinary bladder is unremarkable. On delayed imaging, there is no urothelial wall thickening and there are no filling defects in the opacified portions of the bilateral collecting systems or ureters. Stomach/Bowel: Small and large bowel surgical changes including possible ileocecectomy. Stomach is within normal limits. Several loops of small and large bowel distended with fluid which appears similar to prior. No pneumatosis. No evidence of bowel wall thickening or dilatation. Vascular/Lymphatic: No abdominal aorta or iliac aneurysm. At least moderate calcified and noncalcified atherosclerotic plaque of the aorta and its branches. No abdominal, pelvic, or inguinal  lymphadenopathy. Reproductive: Enlarged prostate status post TURP. Other: No intraperitoneal free fluid. No intraperitoneal free gas. No organized fluid collection. Musculoskeletal: No abdominal wall hernia or abnormality. No suspicious lytic or blastic osseous lesions. No acute displaced fracture. Multilevel degenerative changes of the spine. IMPRESSION: 1. Small and large bowel surgical changes with similar-appearing fluid distended loops of small and large bowel. No inflammatory changes. Findings likely reflect dysmotility versus less likely early/partial obstruction. 2. Prostatomegaly. 3.  Aortic Atherosclerosis (ICD10-I70.0). Electronically Signed   By: MIven FinnM.D.   On: 07/30/2021 20:56    Procedures Procedures   Medications Ordered in ED Medications  sodium chloride 0.9 % bolus 1,000 mL (0 mLs Intravenous Stopped 07/30/21 2019)  fentaNYL (SUBLIMAZE) injection 50 mcg (50 mcg Intravenous Given 07/30/21 1919)  iohexol (OMNIPAQUE) 300 MG/ML solution 75 mL (75 mLs Intravenous Contrast Given 07/30/21 2028)    ED Course  I have reviewed the triage vital signs and the nursing notes.  Pertinent labs & imaging results that were available during my care of the patient were reviewed by me and considered in my medical decision making (see chart for details).    MDM Rules/Calculators/A&P                           RChristpoher Sieversis here with abdominal pain.  Seems to be mostly right lower quadrant.  History of Crohn's.  History of abdominal surgeries and bowel obstructions.  Does not have symptoms consistent with bowel obstruction.  Has normal vitals.  No fever.  Could be a colitis/Crohn's flare versus appendicitis versus less likely bowel obstruction.  Has not had any blood in his stool.  Has not had any nausea or vomiting.  We will get basic labs and CT scan abdomen pelvis.  Patient with CT scan that shows no inflammatory changes.  Overall similar appearing distended loops of small and large  bowels the past.  Likely reflect dysmotility versus an early or partial bowel obstruction.  He does not have symptoms consistent with a bowel obstruction.  He is tolerating p.o., he is passing gas, not having any active nausea or vomiting.  Understands return precautions but overall will discharge in good condition.  Overall suspect acute on chronic pain.  Will prescribe short course of tramadol and Zofran as needed.  Discharged in good condition.  This chart was dictated using voice recognition software.  Despite best efforts to proofread,  errors can occur which can change the documentation meaning.   Final Clinical Impression(s) / ED Diagnoses Final diagnoses:  Right lower quadrant abdominal pain    Rx / DC Orders ED Discharge Orders          Ordered    ondansetron (ZOFRAN) 4 MG tablet  Every 6 hours        07/30/21 2116    traMADol (ULTRAM) 50 MG tablet  Every 6 hours PRN        07/30/21 2116             Lennice Sites, DO 07/30/21 2118

## 2021-07-30 NOTE — Discharge Instructions (Addendum)
Follow-up with primary care doctor.  Please return if you develop any uncontrollable nausea, vomiting, abdominal distention, not passing gas and having bowel movements as discussed.

## 2022-06-08 IMAGING — CT CT ABD-PELV W/ CM
2 of 5 series · 15 of 46 positions shown, 17 images · IV contrast (omnipaque)
Comparison: [id number]

CLINICAL DATA: Right lower quadrant pain with diarrhea. History of
Crohn's disease.

EXAM:
CT ABDOMEN AND PELVIS WITH CONTRAST
TECHNIQUE: Multidetector CT imaging of the abdomen and pelvis was performed
using the standard protocol following bolus administration of
intravenous contrast.
CONTRAST:  80mL OMNIPAQUE IOHEXOL 300 MG/ML  SOLN

[Series 2: axial st · axial · 0.70mm/px · z∈[+448,+854]mm · 12 of 91 slices shown, 14 images]
[im 5/91  soft-tissue]
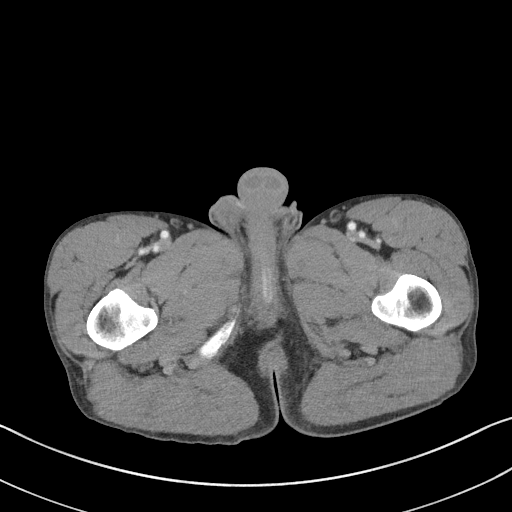
[im 5/91  bone]
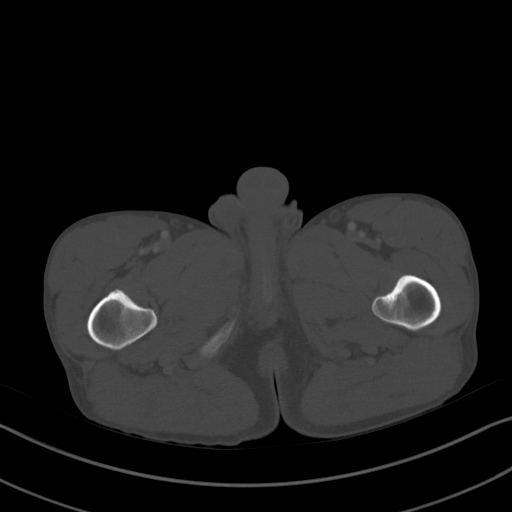
[im 15/91  soft-tissue]
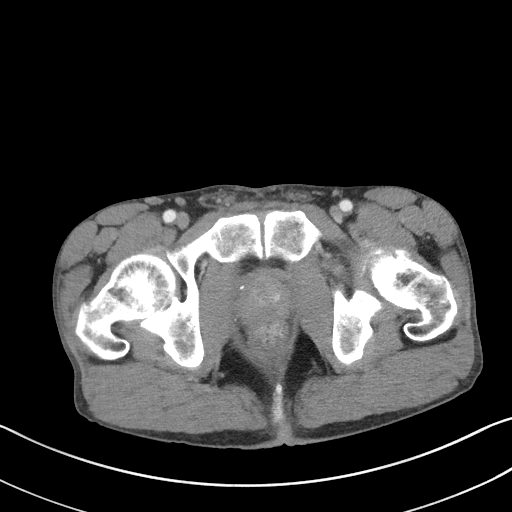
[im 19/91  soft-tissue]
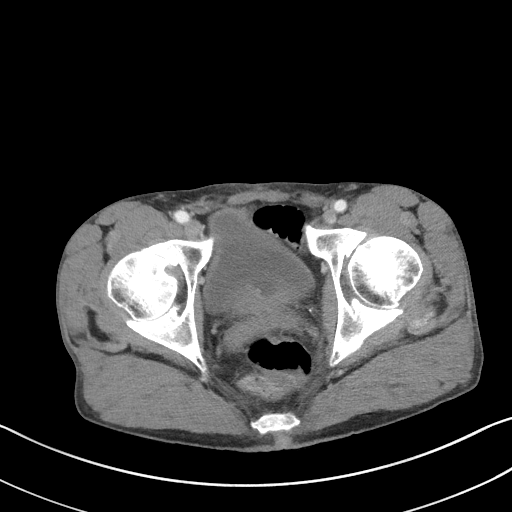
[im 29/91  soft-tissue]
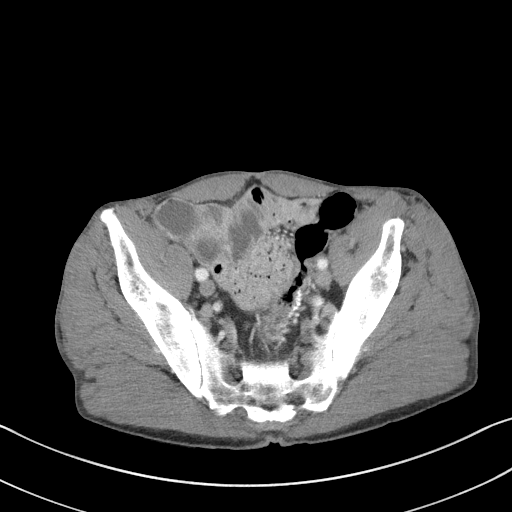
[im 34/91  soft-tissue]
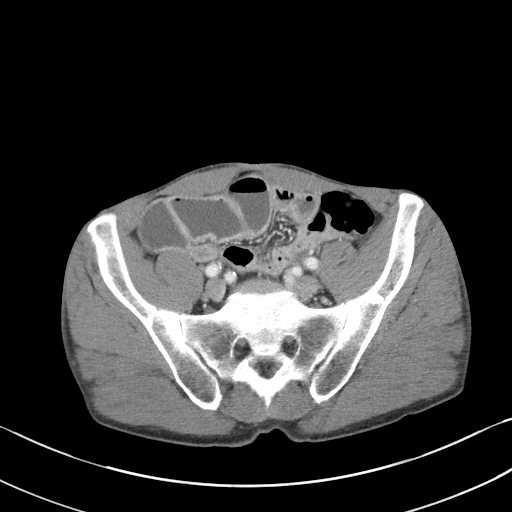
[im 43/91  soft-tissue]
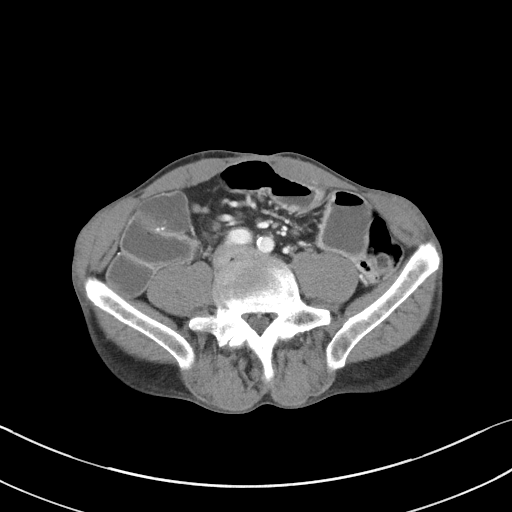
[im 48/91  soft-tissue]
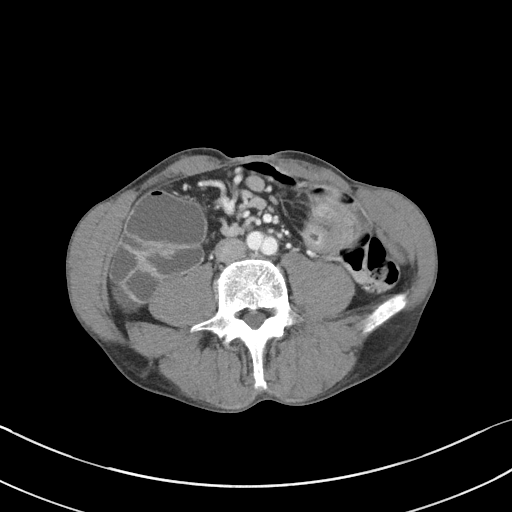
[im 57/91  soft-tissue]
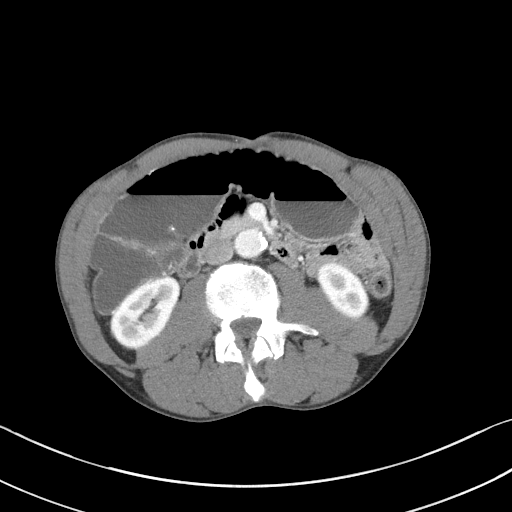
[im 62/91  soft-tissue]
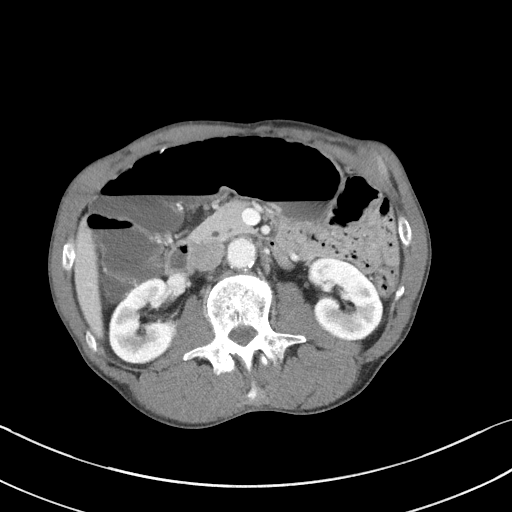
[im 62/91  bone]
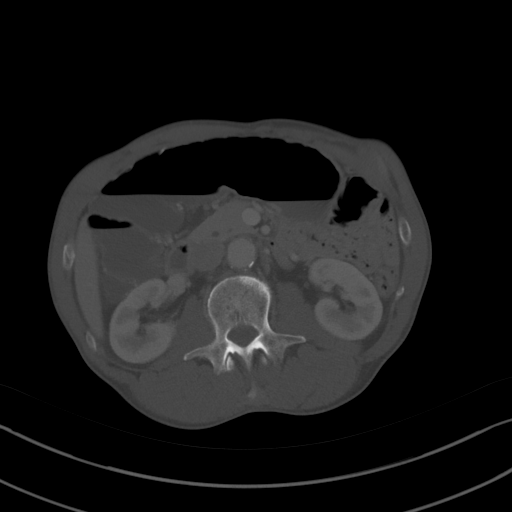
[im 72/91  soft-tissue]
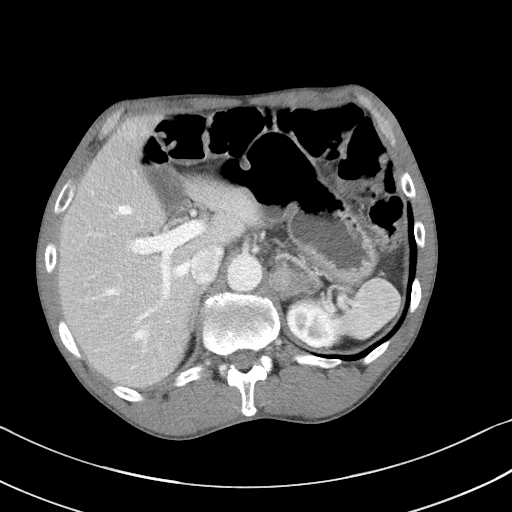
[im 76/91  soft-tissue]
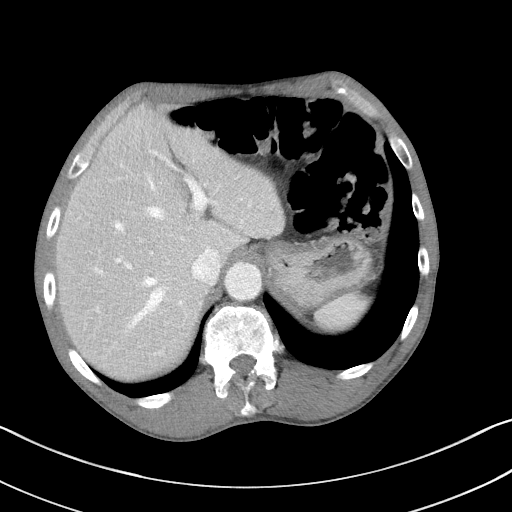
[im 86/91  soft-tissue]
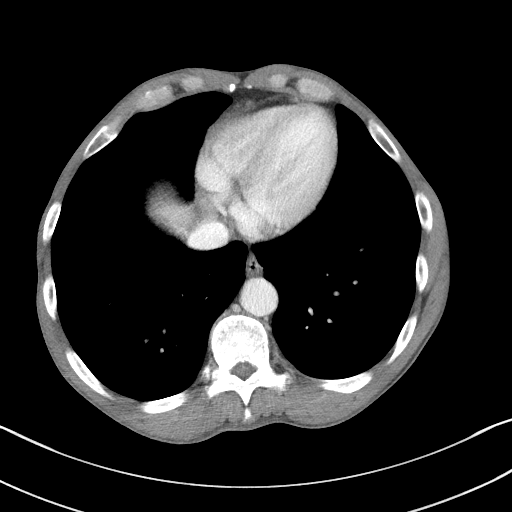

[Series 5: coronal st · coronal · 0.69mm/px · 3 of 97 slices shown]
[im 33/97  soft-tissue]
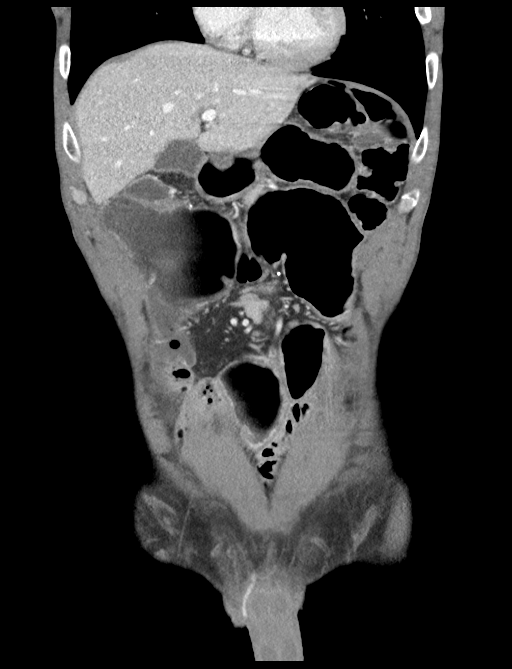
[im 43/97  soft-tissue]
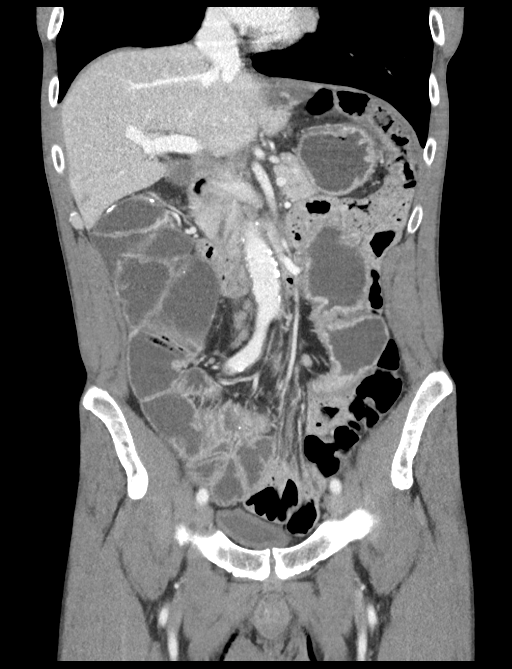
[im 54/97  soft-tissue]
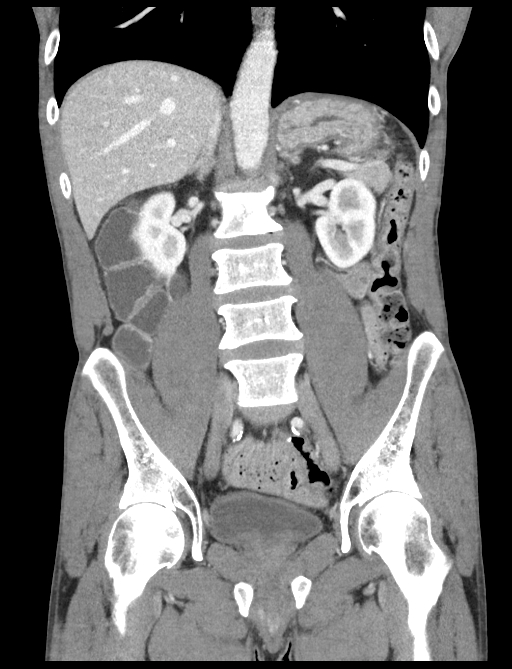

[15 of 46 positions shown; findings below may reference images not displayed]

FINDINGS: Lower chest: Clear lung bases.  Heart normal in size.

Hepatobiliary: 4 small low-density liver lesions, largest in the
inferior right lobe, 8 mm, all consistent with cysts. Liver normal
in size and overall attenuation. No other masses or lesions. Normal
gallbladder. No bile duct dilation.

Pancreas: Unremarkable. No pancreatic ductal dilatation or
surrounding inflammatory changes.

Spleen: Normal in size without focal abnormality.

Adrenals/Urinary Tract: No adrenal masses. Kidneys are normal in
size, orientation and position with symmetric enhancement and
excretion. Small nonobstructing stone in the upper pole the right
kidney. Small low-density lesion along the medial margin of the
right kidney upper pole consistent with a cyst. No hydronephrosis.
Normal ureters. Normal bladder.

Stomach/Bowel: Dilated bowel, which appears to be distal small
bowel, in the central to upper abdomen, 5 cm in diameter, with
air-fluid levels. There are multiple bowel anastomosis staple lines
in the right mid to upper abdomen and right lower quadrant as well
as a colonic anastomosis staple line along the inferior sigmoid
colon. No bowel wall thickening or mesenteric inflammation. Wall of
the proximal stomach appears thickened. Remainder of the stomach is
unremarkable.

Vascular/Lymphatic: Atherosclerotic changes along the abdominal
aorta and iliac vessels. No aneurysm or significant stenosis. There
are prominent to mildly enlarged lymph nodes in the mesentery
largest 11 mm in short axis, similar to the prior CT.

Reproductive: Enlarged heterogeneous prostate with a TURP defect,
unchanged.

Other: No abdominal wall hernia.  No ascites.

Musculoskeletal: No fracture or acute finding. Advanced arthropathic
changes of the hips. Mild degenerative changes of the SI joints.
There is of sclerosis along the anterior lower endplate of T12 and
anterior aspect of L2, presumed degenerative in origin. No
osteolytic lesions.
IMPRESSION: 1. Small-bowel dilation most evident in central to upper abdomen,
maximum diameter 5 cm. No defined transition point although the
proximal small bowel is normal in caliber. This could reflect a
partial obstruction. It may be functional due to dysmotility.
Similar findings were present on the prior CT although the degree of
bowel dilation is less on the current exam. Stable changes from
previous bowel surgery and anastomoses.
2. Mild wall thickening of the proximal stomach is suggested, but
this portion of the stomach is nondistended making this finding
equivocal.
3. No convincing active Crohn's disease.
4. Prominent mesenteric lymph nodes, largest 11 mm in short axis,
similar to the prior CT. These are presumed reactive.
5. Aortic atherosclerosis.

## 2023-01-17 IMAGING — CT CT ABD-PELV W/ CM
2 of 5 series · 15 of 46 positions shown, 17 images · IV contrast (omnipaque)
Comparison: CT abdomen pelvis 12/19/2020

CLINICAL DATA: Right lower quadrant pain. Crohn's disease. Multiple
myeloma. BPH.

EXAM:
CT ABDOMEN AND PELVIS WITH CONTRAST
TECHNIQUE: Multidetector CT imaging of the abdomen and pelvis was performed
using the standard protocol following bolus administration of
intravenous contrast.
CONTRAST:  75mL OMNIPAQUE IOHEXOL 300 MG/ML  SOLN

[Series 2: axial st · axial · 0.67mm/px · z∈[-363,+37]mm · 12 of 90 slices shown, 14 images]
[im 5/90  soft-tissue]
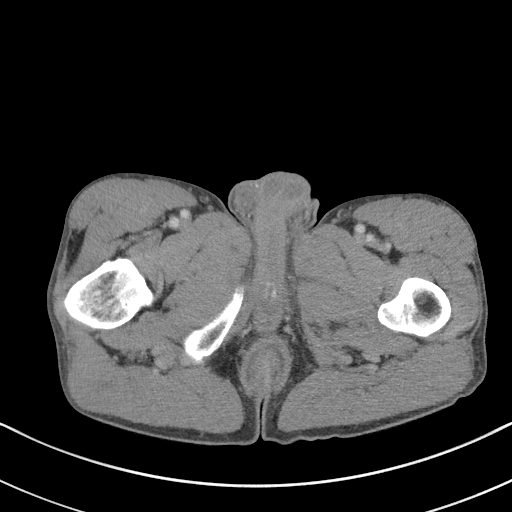
[im 5/90  bone]
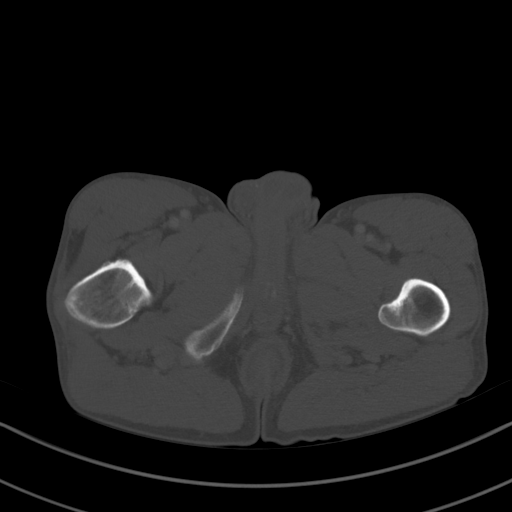
[im 15/90  soft-tissue]
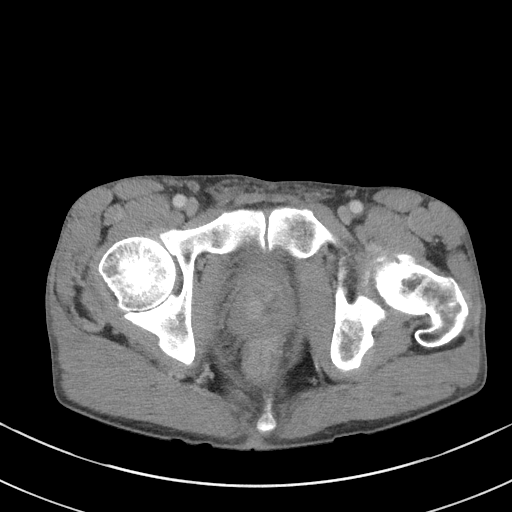
[im 20/90  soft-tissue]
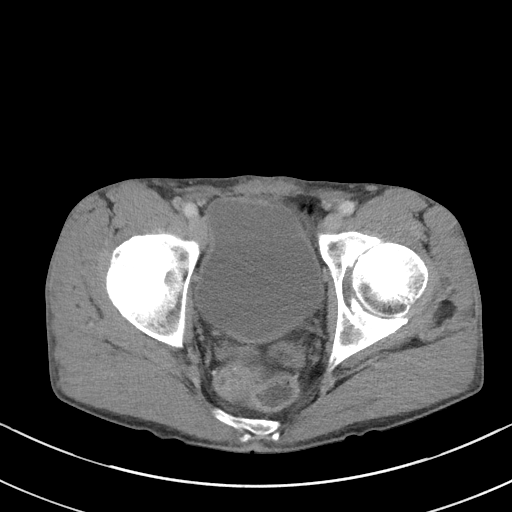
[im 25/90  soft-tissue]
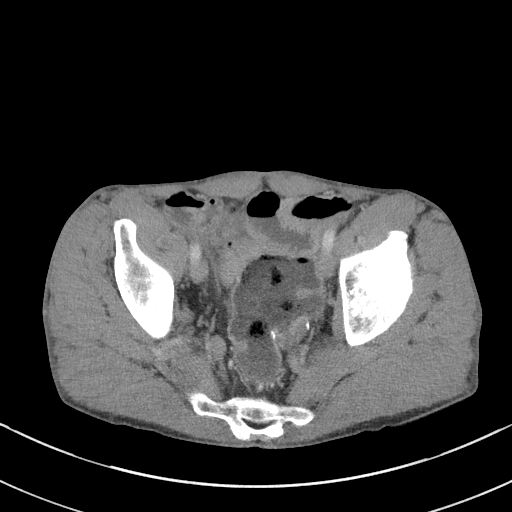
[im 35/90  soft-tissue]
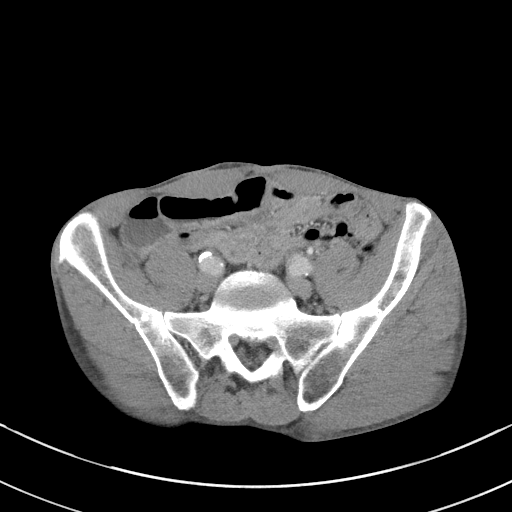
[im 40/90  soft-tissue]
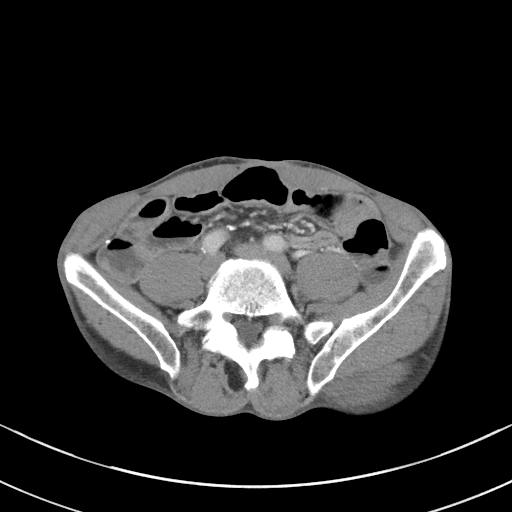
[im 50/90  soft-tissue]
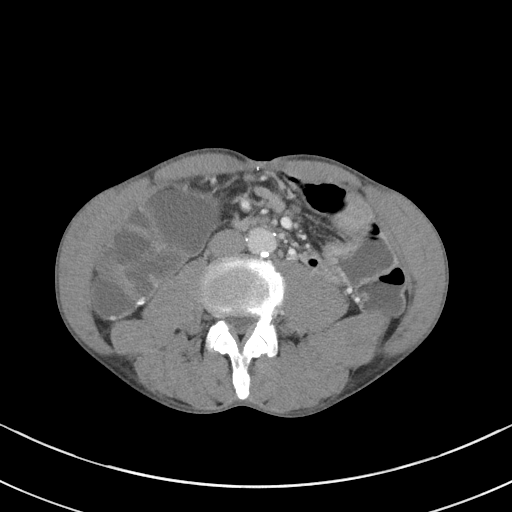
[im 55/90  soft-tissue]
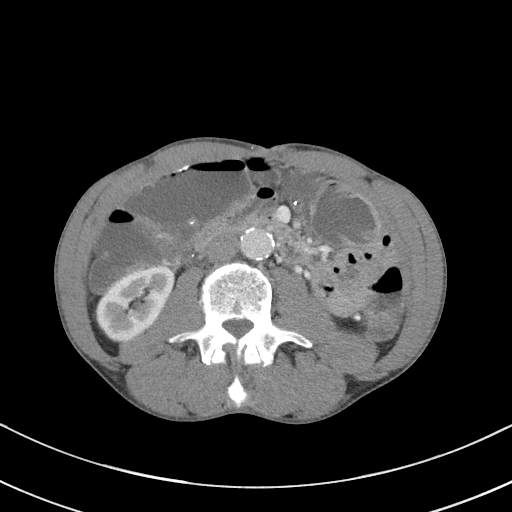
[im 65/90  soft-tissue]
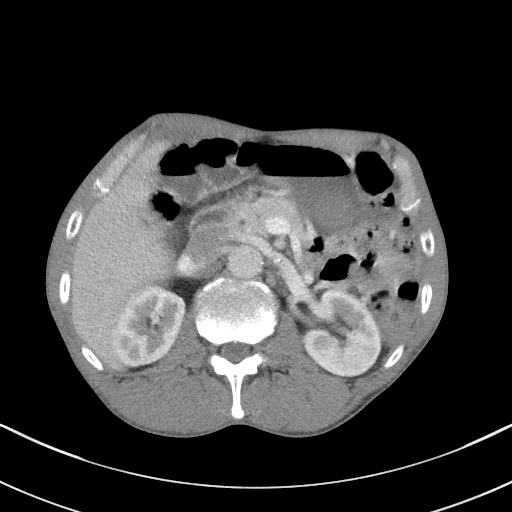
[im 65/90  bone]
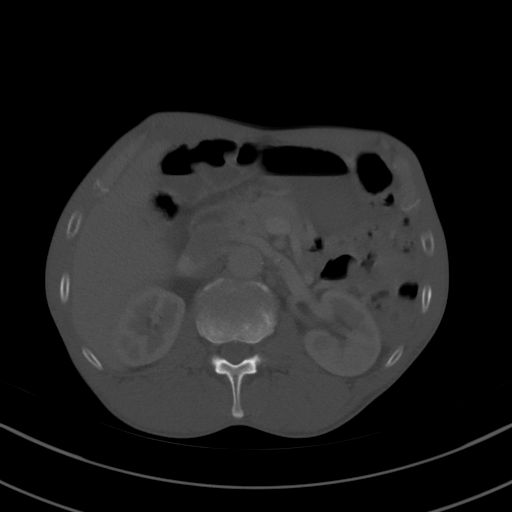
[im 70/90  soft-tissue]
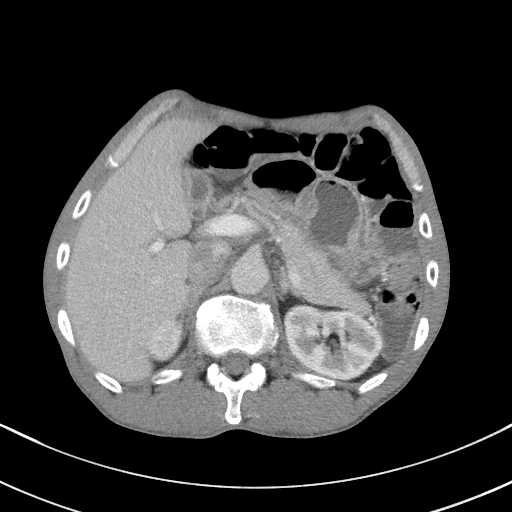
[im 75/90  soft-tissue]
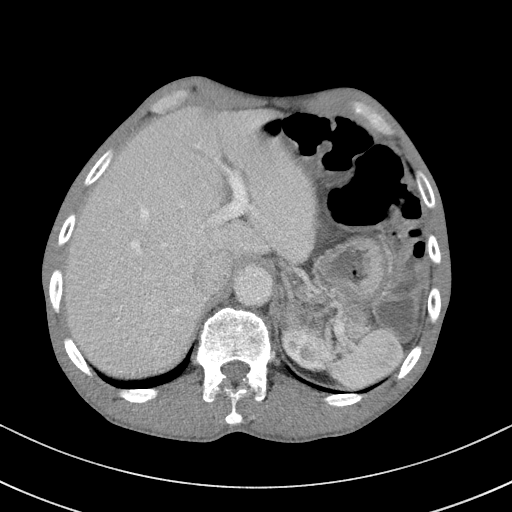
[im 85/90  soft-tissue]
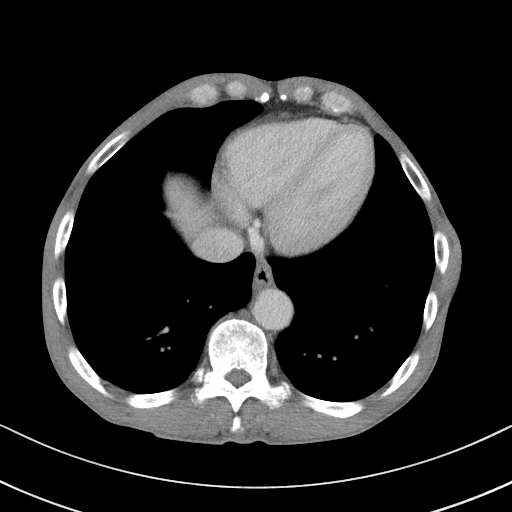

[Series 5: coronal st · coronal · 0.67mm/px · 3 of 101 slices shown]
[im 34/101  soft-tissue]
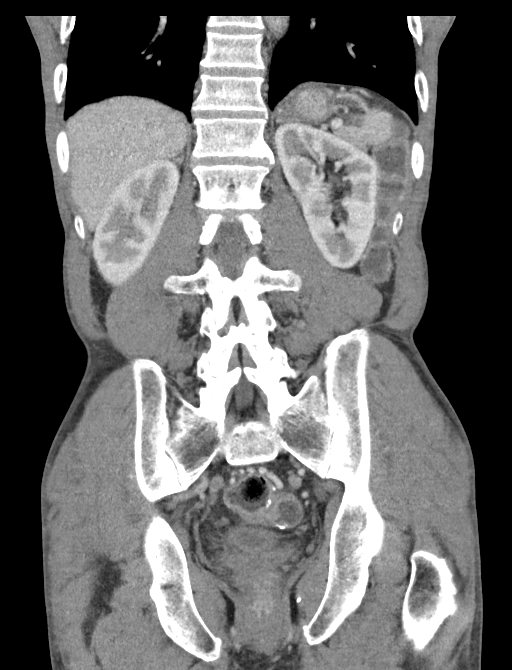
[im 45/101  soft-tissue]
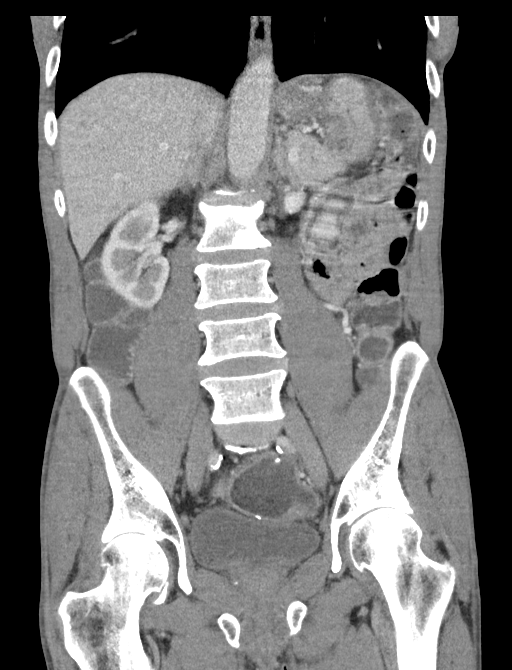
[im 56/101  soft-tissue]
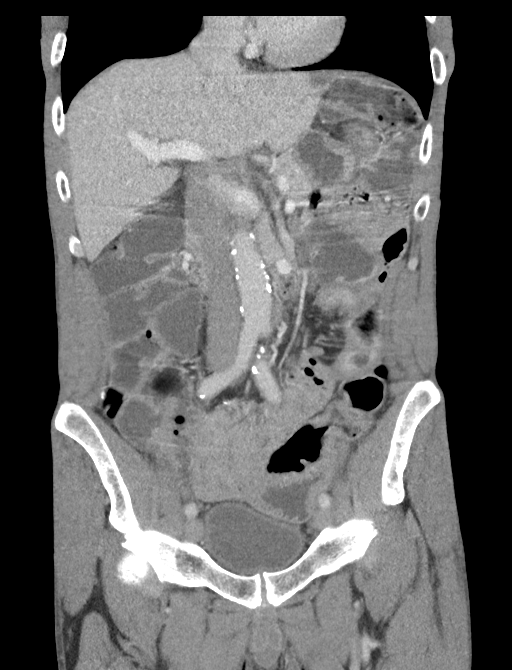

[15 of 46 positions shown; findings below may reference images not displayed]

FINDINGS: Lower chest: No acute abnormality.

Hepatobiliary: Similar-appearing pericentimeter subcentimeter
hypodensities within the liver. No gallstones, gallbladder wall
thickening, or pericholecystic fluid. No biliary dilatation.

Pancreas: No focal lesion. Normal pancreatic contour. No surrounding
inflammatory changes. No main pancreatic ductal dilatation.

Spleen: Normal in size without focal abnormality.

Adrenals/Urinary Tract:

No adrenal nodule bilaterally.

Bilateral kidneys enhance symmetrically.

No hydronephrosis. No hydroureter.

The urinary bladder is unremarkable.

On delayed imaging, there is no urothelial wall thickening and there
are no filling defects in the opacified portions of the bilateral
collecting systems or ureters.

Stomach/Bowel: Small and large bowel surgical changes including
possible ileocecectomy. Stomach is within normal limits. Several
loops of small and large bowel distended with fluid which appears
similar to prior. No pneumatosis. No evidence of bowel wall
thickening or dilatation.

Vascular/Lymphatic: No abdominal aorta or iliac aneurysm. At least
moderate calcified and noncalcified atherosclerotic plaque of the
aorta and its branches. No abdominal, pelvic, or inguinal
lymphadenopathy.

Reproductive: Enlarged prostate status post TURP.

Other: No intraperitoneal free fluid. No intraperitoneal free gas.
No organized fluid collection.

Musculoskeletal:

No abdominal wall hernia or abnormality.

No suspicious lytic or blastic osseous lesions. No acute displaced
fracture. Multilevel degenerative changes of the spine.
IMPRESSION: 1. Small and large bowel surgical changes with similar-appearing
fluid distended loops of small and large bowel. No inflammatory
changes. Findings likely reflect dysmotility versus less likely
early/partial obstruction.
2. Prostatomegaly.
3.  Aortic Atherosclerosis ([id number]).

## 2024-06-03 ENCOUNTER — Encounter (HOSPITAL_BASED_OUTPATIENT_CLINIC_OR_DEPARTMENT_OTHER): Payer: Self-pay | Admitting: Emergency Medicine

## 2024-06-03 ENCOUNTER — Emergency Department (HOSPITAL_BASED_OUTPATIENT_CLINIC_OR_DEPARTMENT_OTHER)
Admission: EM | Admit: 2024-06-03 | Discharge: 2024-06-03 | Disposition: A | Discharge status: Home / Self Care | Attending: Emergency Medicine | Admitting: Emergency Medicine

## 2024-06-03 ENCOUNTER — Emergency Department (HOSPITAL_BASED_OUTPATIENT_CLINIC_OR_DEPARTMENT_OTHER)

## 2024-06-03 ENCOUNTER — Other Ambulatory Visit: Payer: Self-pay

## 2024-06-03 DIAGNOSIS — K5090 Crohn's disease, unspecified, without complications: Secondary | ICD-10-CM | POA: Insufficient documentation

## 2024-06-03 DIAGNOSIS — X30XXXA Exposure to excessive natural heat, initial encounter: Secondary | ICD-10-CM | POA: Diagnosis not present

## 2024-06-03 DIAGNOSIS — F17210 Nicotine dependence, cigarettes, uncomplicated: Secondary | ICD-10-CM | POA: Insufficient documentation

## 2024-06-03 DIAGNOSIS — T733XXA Exhaustion due to excessive exertion, initial encounter: Secondary | ICD-10-CM | POA: Insufficient documentation

## 2024-06-03 DIAGNOSIS — E860 Dehydration: Secondary | ICD-10-CM | POA: Insufficient documentation

## 2024-06-03 DIAGNOSIS — R531 Weakness: Secondary | ICD-10-CM | POA: Diagnosis present

## 2024-06-03 DIAGNOSIS — Z79899 Other long term (current) drug therapy: Secondary | ICD-10-CM | POA: Diagnosis not present

## 2024-06-03 DIAGNOSIS — J45909 Unspecified asthma, uncomplicated: Secondary | ICD-10-CM | POA: Diagnosis not present

## 2024-06-03 DIAGNOSIS — C9000 Multiple myeloma not having achieved remission: Secondary | ICD-10-CM | POA: Insufficient documentation

## 2024-06-03 LAB — CBC WITH DIFFERENTIAL/PLATELET
Abs Immature Granulocytes: 0.02 10*3/uL (ref 0.00–0.07)
Basophils Absolute: 0 10*3/uL (ref 0.0–0.1)
Basophils Relative: 0 %
Eosinophils Absolute: 0.1 10*3/uL (ref 0.0–0.5)
Eosinophils Relative: 1 %
HCT: 39.9 % (ref 39.0–52.0)
Hemoglobin: 12.8 g/dL — ABNORMAL LOW (ref 13.0–17.0)
Immature Granulocytes: 0 %
Lymphocytes Relative: 22 %
Lymphs Abs: 1.8 10*3/uL (ref 0.7–4.0)
MCH: 26.9 pg (ref 26.0–34.0)
MCHC: 32.1 g/dL (ref 30.0–36.0)
MCV: 83.8 fL (ref 80.0–100.0)
Monocytes Absolute: 0.9 10*3/uL (ref 0.1–1.0)
Monocytes Relative: 11 %
Neutro Abs: 5.3 10*3/uL (ref 1.7–7.7)
Neutrophils Relative %: 66 %
Platelets: 269 10*3/uL (ref 150–400)
RBC: 4.76 MIL/uL (ref 4.22–5.81)
RDW: 17.5 % — ABNORMAL HIGH (ref 11.5–15.5)
WBC: 8.1 10*3/uL (ref 4.0–10.5)
nRBC: 0 % (ref 0.0–0.2)

## 2024-06-03 LAB — COMPREHENSIVE METABOLIC PANEL WITH GFR
ALT: 22 U/L (ref 0–44)
AST: 25 U/L (ref 15–41)
Albumin: 3.6 g/dL (ref 3.5–5.0)
Alkaline Phosphatase: 123 U/L (ref 38–126)
Anion gap: 10 (ref 5–15)
BUN: 19 mg/dL (ref 8–23)
CO2: 25 mmol/L (ref 22–32)
Calcium: 9.3 mg/dL (ref 8.9–10.3)
Chloride: 105 mmol/L (ref 98–111)
Creatinine, Ser: 1.45 mg/dL — ABNORMAL HIGH (ref 0.61–1.24)
GFR, Estimated: 53 mL/min — ABNORMAL LOW (ref >60)
Glucose, Bld: 91 mg/dL (ref 70–99)
Potassium: 3.7 mmol/L (ref 3.5–5.1)
Sodium: 140 mmol/L (ref 135–145)
Total Bilirubin: <0.2 mg/dL (ref 0.0–1.2)
Total Protein: 6.7 g/dL (ref 6.5–8.1)

## 2024-06-03 LAB — RESP PANEL BY RT-PCR (RSV, FLU A&B, COVID)  RVPGX2
Influenza A by PCR: NEGATIVE
Influenza B by PCR: NEGATIVE
Resp Syncytial Virus by PCR: NEGATIVE
SARS Coronavirus 2 by RT PCR: NEGATIVE

## 2024-06-03 LAB — MAGNESIUM: Magnesium: 1.7 mg/dL (ref 1.7–2.4)

## 2024-06-03 LAB — TROPONIN T, HIGH SENSITIVITY: Troponin T High Sensitivity: <15 ng/L (ref <19)

## 2024-06-03 MED ORDER — SODIUM CHLORIDE 0.9 % IV BOLUS
1000.0000 mL | Freq: Once | INTRAVENOUS | Status: AC
  Administered 2024-06-03: 1000 mL via INTRAVENOUS

## 2024-06-03 NOTE — ED Notes (Signed)
 ED Provider at bedside.

## 2024-06-03 NOTE — ED Triage Notes (Signed)
 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

## 2024-06-03 NOTE — ED Notes (Signed)
 Attempted IV access to LAC, tol well but unsuccessful

## 2024-06-03 NOTE — ED Provider Notes (Signed)
 [place] [facility] Provider Note  CSN: [id number] Arrival date & time: 06/03/24 9147  Chief Complaint(s) Weakness  HPI Evan Gallegos is a 68 y.o. male with past medical history as below, significant for multiple myeloma, Crohn's disease, asthma who presents to the ED with complaint of weakness, fatigue  Patient reports he was working outside on Friday, not drink much water, he has been feeling tired, weak globally, dehydrated since then.  He has no pain, no dyspnea, no vomiting or nausea.  No change in bowel or bladder function.  No numbness or weakness to extremities.  No palpitations.  No sick contacts recent travel.  Tolerating p.o. 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 Authorizing Provider  azaTHIOprine (IMURAN) 50 MG tablet Take 150 mg by mouth. 11/20/14   [provider]  cyclobenzaprine  (FLEXERIL ) 10 MG tablet Take 1 tablet (10 mg total) by mouth 2 (two) times daily as needed for muscle spasms. 01/15/18   Dreama Longs, MD  diclofenac  (VOLTAREN ) 75 MG EC tablet Take 1 tablet (75 mg total) by mouth 2 (two) times daily. 07/03/15   Hudnall, Ludie SAUNDERS, MD  ondansetron  (ZOFRAN ) 4 MG tablet Take 1 tablet (4 mg total) by mouth every 6 (six) hours. 07/30/21   Curatolo, Adam, DO  potassium chloride  (K-DUR) 10 MEQ  tablet Take 2 tablets (20 mEq total) by mouth daily. 09/25/13   Palmer, Harlene POUR, PA-C  predniSONE  (DELTASONE ) 10 MG tablet Take 1 tablet (10 mg total) by mouth daily. 06/10/17   Robinson, [place] N, PA-C  predniSONE  (DELTASONE ) 5 MG tablet Take 5 mg by mouth daily.    [provider]  Tamsulosin HCl (FLOMAX) 0.4 MG CAPS Take 0.4 mg by mouth.    [provider]  traMADol  (ULTRAM ) 50 MG tablet Take 1 tablet (50 mg total) by mouth every 6 (six) hours as needed. 07/30/21   Curatolo, Adam, DO  vitamin B-12 (CYANOCOBALAMIN) 1000 MCG tablet Take 1,000 mcg by mouth.    [provider]                                                                                                                                    Past Surgical History Past Surgical History:  Procedure Laterality Date   SMALL INTESTINE SURGERY     Family History History reviewed. No pertinent family history.  Social History Social History   Tobacco Use   Smoking status: Every Day    Current packs/day: 0.50    Types: Cigarettes   Smokeless tobacco: Never  Substance Use Topics   Alcohol use: No    Alcohol/week: 0.0 standard drinks of alcohol   Drug use: No   Allergies Penicillins  Review of Systems A thorough review of systems was obtained and all systems are negative except as noted in the HPI and PMH.   Physical Exam Vital Signs  I have reviewed the triage vital signs BP 138/83   Pulse (!) 50   Temp 98.5 F (36.9 C) (Oral)   Resp 16   Ht 5' 11 (1.803 m)   Wt 68.9 kg   SpO2 100%   BMI 21.20 kg/m  Physical Exam Vitals and nursing note reviewed.  Constitutional:      General: He is not in acute distress.    Appearance: He is well-developed.  HENT:     Head: Normocephalic and atraumatic.     Right Ear: External ear normal.     Left Ear: External ear normal.     Mouth/Throat:     Mouth: Mucous membranes are dry.   Eyes:     General: No scleral icterus.   Cardiovascular:      Rate and Rhythm: Normal rate and regular rhythm.     Pulses: Normal pulses.     Heart sounds: Normal heart sounds.  Pulmonary:     Effort: Pulmonary effort is normal. No respiratory distress.     Breath sounds: Normal breath sounds.  Abdominal:     General: Abdomen is flat.     Palpations: Abdomen is soft.     Tenderness: There is no abdominal tenderness.   Musculoskeletal:     Cervical back: No rigidity.     Right lower leg: No edema.     Left lower leg: No edema.   Skin:    General: Skin is warm and dry.     Capillary Refill: Capillary refill takes less than 2 seconds.   Neurological:     Mental Status: He is alert.   Psychiatric:        Mood and Affect: Mood normal.        Behavior: Behavior normal.     ED Results and Treatments Labs (all labs ordered are listed, but only abnormal results are displayed) Labs Reviewed  CBC WITH DIFFERENTIAL/PLATELET - Abnormal; Notable for the following components:      Result Value   Hemoglobin 12.8 (*)    RDW 17.5 (*)    All other components within normal limits  COMPREHENSIVE METABOLIC PANEL WITH GFR - Abnormal; Notable for the following components:   Creatinine, Ser 1.45 (*)    GFR, Estimated 53 (*)    All other components within normal limits  RESP PANEL BY RT-PCR (RSV, FLU A&B, COVID)  RVPGX2  MAGNESIUM  TROPONIN T, HIGH SENSITIVITY  Radiology No results found.   Pertinent labs & imaging results that were available during my care of the patient were reviewed by me and considered in my medical decision making (see MDM for details).  Medications Ordered in ED Medications  sodium chloride  0.9 % bolus 1,000 mL (0 mLs Intravenous Stopped 06/03/24 1138)                                                                                                                                      Procedures Procedures  (including critical care time)  Medical Decision Making / ED Course    Medical Decision Making:    Evan Gallegos is a 68 y.o. male with past medical history as below, significant for multiple myeloma, Crohn's disease, asthma who presents to the ED with complaint of weakness, fatigue. The complaint involves an extensive differential diagnosis and also carries with it a high risk of complications and morbidity.  Serious etiology was considered. Ddx includes but is not limited to: Dehydration, infection, metabolic dysfunction, ACS, viral syndrome, etc.  Complete initial physical exam performed, notably the patient was in no distress, sitting upright in stretcher.    Reviewed and confirmed nursing documentation for past medical history, family history, social history.  Vital signs reviewed.     Brief summary:  68 year old male history as above here with generalized weakness, fatigue, concern for dehydration       Labs reviewed, creatinine is elevated, given IV fluids.  He is tolerant p.o. intake without difficulty at this time.  He is feeling much better after receiving IV fluids.  Favor his weakness sensation is likely secondary to overexertion and poor hydration while working out in the sun.  No evidence of ACS,, no neurologic findings or evidence of a central neurologic process, no focal neurologic deficits.  He is feeling much better, he is tolerant p.o. intake with difficulty, he is ambulatory around the room.  Encouraged ongoing rehydration at home  Patient in no distress and overall condition is stable. Detailed discussions were had with the patient/guardian regarding current findings, and need for close f/u with PCP or on call doctor. The patient/guardian has been instructed to return immediately if the symptoms worsen in any way for re-evaluation. Patient/guardian verbalized understanding and is in agreement with current care plan. All questions answered  prior to discharge.              Additional history obtained: -Additional history obtained from spouse -External records from outside source obtained and reviewed including: Chart review including previous notes, labs, imaging, consultation notes including  Primary care documentation, prior ER visit   Lab Tests: -I ordered, reviewed, and interpreted labs.   The pertinent results include:   Labs Reviewed  CBC WITH DIFFERENTIAL/PLATELET - Abnormal; Notable for the following components:      Result Value   Hemoglobin 12.8 (*)    RDW 17.5 (*)  All other components within normal limits  COMPREHENSIVE METABOLIC PANEL WITH GFR - Abnormal; Notable for the following components:   Creatinine, Ser 1.45 (*)    GFR, Estimated 53 (*)    All other components within normal limits  RESP PANEL BY RT-PCR (RSV, FLU A&B, COVID)  RVPGX2  MAGNESIUM  TROPONIN T, HIGH SENSITIVITY    Notable for as above  EKG   EKG Interpretation Date/Time:  Sunday June 03 2024 09:19:20 EDT Ventricular Rate:  67 PR Interval:  149 QRS Duration:  96 QT Interval:  403 QTC Calculation: 426 R Axis:   53  Text Interpretation: Sinus rhythm no prior Interpretation limited secondary to artifact Confirmed by Elnor Savant ([id number]) on 06/03/2024 10:29:25 AM         Imaging Studies ordered: I ordered imaging studies including cxr I independently visualized the following imaging with scope of interpretation limited to determining acute life threatening conditions related to emergency care; findings noted above I agree with the radiologist interpretation If any imaging was obtained with contrast I closely monitored patient for any possible adverse reaction a/w contrast administration in the emergency department   Medicines ordered and prescription drug management: Meds ordered this encounter  Medications   sodium chloride  0.9 % bolus 1,000 mL    -I have reviewed the patients home medicines and have made  adjustments as needed   Consultations Obtained: Not applicable  Cardiac Monitoring: The patient was maintained on a cardiac monitor.  I personally viewed and interpreted the cardiac monitored which showed an underlying rhythm of: nsr Continuous pulse oximetry interpreted by myself, 100% on ra.    Social Determinants of Health:  Diagnosis or treatment significantly limited by social determinants of health: current smoker   Reevaluation: After the interventions noted above, I reevaluated the patient and found that they have improved  Co morbidities that complicate the patient evaluation  Past Medical History:  Diagnosis Date   Abdominal pain, other specified site    crhon's disease   Asthma    Crohn's disease (HCC)    Multiple myeloma (HCC)    Prostate enlargement       Dispostion: Disposition decision including need for hospitalization was considered, and patient discharged from emergency department.    Final Clinical Impression(s) / ED Diagnoses Final diagnoses:  Fatigue due to excessive exertion, initial encounter  Mild dehydration        Elnor Savant LABOR, DO 06/05/24 2334

## 2024-06-03 NOTE — ED Notes (Signed)
 Phlebotomy attempt x2 left arm, unsuccessful.  Patient tolerated well, nursing staff aware.

## 2024-06-03 NOTE — Discharge Instructions (Addendum)
 It was a pleasure caring for you today in the emergency department.  Your workup today is re-assuring, labs are stable. Fatigue is likely secondary to dehydration and over exertion. Be sure to get plenty of rest over the next few days and drink plenty of fluids. Follow up with your pcp.   Please return to the emergency department for any worsening or worrisome symptoms.

## 2024-06-03 NOTE — ED Notes (Addendum)
 Pt drinking ginger ale, states feels better
